# Patient Record
Sex: Male | Born: 1960 | Race: White | Hispanic: No | Smoking: Former smoker
Health system: Southern US, Community
[De-identification: ages and names within clinical notes are randomized; demographics above are authoritative.]

## PROBLEM LIST (undated history)

## (undated) DIAGNOSIS — E669 Obesity, unspecified: Secondary | ICD-10-CM

## (undated) DIAGNOSIS — E7800 Pure hypercholesterolemia, unspecified: Secondary | ICD-10-CM

## (undated) DIAGNOSIS — E119 Type 2 diabetes mellitus without complications: Secondary | ICD-10-CM

## (undated) DIAGNOSIS — I10 Essential (primary) hypertension: Secondary | ICD-10-CM

## (undated) HISTORY — PX: NOSE SURGERY: SHX723

---

## 2001-05-13 ENCOUNTER — Emergency Department (HOSPITAL_COMMUNITY): Admission: EM | Admit: 2001-05-13 | Discharge: 2001-05-14 | Payer: Self-pay | Admitting: Emergency Medicine

## 2008-06-04 ENCOUNTER — Emergency Department (HOSPITAL_COMMUNITY): Admission: EM | Admit: 2008-06-04 | Discharge: 2008-06-04 | Payer: Self-pay | Admitting: Emergency Medicine

## 2020-02-28 ENCOUNTER — Inpatient Hospital Stay (HOSPITAL_COMMUNITY)
Admission: EM | Admit: 2020-02-28 | Discharge: 2020-03-01 | DRG: 638 | Disposition: A | Discharge status: Home / Self Care | Payer: Self-pay | Attending: Internal Medicine | Admitting: Internal Medicine

## 2020-02-28 ENCOUNTER — Encounter (HOSPITAL_COMMUNITY): Payer: Self-pay | Admitting: Emergency Medicine

## 2020-02-28 ENCOUNTER — Other Ambulatory Visit: Payer: Self-pay

## 2020-02-28 DIAGNOSIS — E1310 Other specified diabetes mellitus with ketoacidosis without coma: Secondary | ICD-10-CM

## 2020-02-28 DIAGNOSIS — R7401 Elevation of levels of liver transaminase levels: Secondary | ICD-10-CM | POA: Diagnosis present

## 2020-02-28 DIAGNOSIS — E669 Obesity, unspecified: Secondary | ICD-10-CM | POA: Diagnosis present

## 2020-02-28 DIAGNOSIS — Z87891 Personal history of nicotine dependence: Secondary | ICD-10-CM

## 2020-02-28 DIAGNOSIS — Z79899 Other long term (current) drug therapy: Secondary | ICD-10-CM

## 2020-02-28 DIAGNOSIS — E1165 Type 2 diabetes mellitus with hyperglycemia: Secondary | ICD-10-CM | POA: Diagnosis present

## 2020-02-28 DIAGNOSIS — E119 Type 2 diabetes mellitus without complications: Secondary | ICD-10-CM

## 2020-02-28 DIAGNOSIS — Z20822 Contact with and (suspected) exposure to covid-19: Secondary | ICD-10-CM | POA: Diagnosis present

## 2020-02-28 DIAGNOSIS — I10 Essential (primary) hypertension: Secondary | ICD-10-CM | POA: Diagnosis present

## 2020-02-28 DIAGNOSIS — Z8249 Family history of ischemic heart disease and other diseases of the circulatory system: Secondary | ICD-10-CM

## 2020-02-28 DIAGNOSIS — E871 Hypo-osmolality and hyponatremia: Secondary | ICD-10-CM | POA: Diagnosis present

## 2020-02-28 DIAGNOSIS — E7800 Pure hypercholesterolemia, unspecified: Secondary | ICD-10-CM | POA: Diagnosis present

## 2020-02-28 DIAGNOSIS — Z6832 Body mass index (BMI) 32.0-32.9, adult: Secondary | ICD-10-CM

## 2020-02-28 DIAGNOSIS — E1110 Type 2 diabetes mellitus with ketoacidosis without coma: Principal | ICD-10-CM | POA: Diagnosis present

## 2020-02-28 DIAGNOSIS — D751 Secondary polycythemia: Secondary | ICD-10-CM | POA: Diagnosis present

## 2020-02-28 HISTORY — DX: Pure hypercholesterolemia, unspecified: E78.00

## 2020-02-28 HISTORY — DX: Type 2 diabetes mellitus without complications: E11.9

## 2020-02-28 HISTORY — DX: Essential (primary) hypertension: I10

## 2020-02-28 HISTORY — DX: Obesity, unspecified: E66.9

## 2020-02-28 NOTE — ED Provider Notes (Signed)
[facility] EMERGENCY DEPARTMENT Provider Note   CSN: [id number] Arrival date & time: 02/28/20  1841     History Chief Complaint  Patient presents with  . Hypertension    Glen Webb is a 59 y.o. male.  12/23 felt unwell went to [place]. Had been having polyuria and polydipsia. BP was 160/103 - blood work and urine. Did some type of tests. A1c 13.5. apparently a bunch of other things were elevated as well and was told to go the emergency room but didn't tell him what was going on. Had hyperglycemia as well in the 400's improved to less than 200 by eating differently (not eating at all). No recent illnesses otherwise.    Hypertension       Past Medical History:  Diagnosis Date  . Hypercholesteremia   . Hypertension     There are no problems to display for this patient.   Past Surgical History:  Procedure Laterality Date  . NOSE SURGERY         History reviewed. No pertinent family history.  Social History   Tobacco Use  . Smoking status: Former Games developer  . Smokeless tobacco: Never Used  Vaping Use  . Vaping Use: Never used  Substance Use Topics  . Alcohol use: Never  . Drug use: Never    Home Medications Prior to Admission medications   Not on File    Allergies    Patient has no allergy information on record.  Review of Systems   Review of Systems  All other systems reviewed and are negative.   Physical Exam Updated Vital Signs BP (!) 148/98   Pulse 96   Temp 98 F (36.7 C) (Oral)   Resp 13   Ht 5\' 6"  (1.676 m)   Wt 91.6 kg   SpO2 99%   BMI 32.60 kg/m   Physical Exam Vitals and nursing note reviewed.  Constitutional:      Appearance: He is well-developed and well-nourished.  HENT:     Head: Normocephalic and atraumatic.     Nose: No congestion or rhinorrhea.     Mouth/Throat:     Mouth: Mucous membranes are moist.     Pharynx: Oropharynx is clear.  Eyes:     Pupils: Pupils are equal, round, and reactive to light.   Cardiovascular:     Rate and Rhythm: Normal rate.  Pulmonary:     Effort: Pulmonary effort is normal. No respiratory distress.  Abdominal:     General: There is no distension.  Musculoskeletal:        General: Normal range of motion.     Cervical back: Normal range of motion.  Skin:    General: Skin is warm and dry.  Neurological:     General: No focal deficit present.     Mental Status: He is alert.     ED Results / Procedures / Treatments   Labs (all labs ordered are listed, but only abnormal results are displayed) Labs Reviewed - No data to display  EKG None  Radiology No results found.  Procedures .Critical Care Performed by: , MD Authorized by: Marily Memos, MD   Critical care provider statement:    Critical care time (minutes):  45   Critical care was necessary to treat or prevent imminent or life-threatening deterioration of the following conditions:  Endocrine crisis   Critical care was time spent personally by me on the following activities:  Discussions with consultants, evaluation of patient's response to treatment, examination  of patient, ordering and performing treatments and interventions, ordering and review of laboratory studies, ordering and review of radiographic studies, pulse oximetry, re-evaluation of patient's condition, obtaining history from patient or surrogate and review of old charts   (including critical care time)  Medications Ordered in ED Medications - No data to display  ED Course  I have reviewed the triage vital signs and the nursing notes.  Pertinent labs & imaging results that were available during my care of the patient were reviewed by me and considered in my medical decision making (see chart for details).     MDM Rules/Calculators/A&P                          Patient found to have likely nearly euglycemic DKA but also mild AKI. BP reasonable. Fluids given. glucostabilizer initiated. Patient admitted to  hospitalist for same.   Final Clinical Impression(s) / ED Diagnoses Final diagnoses:  Diabetic ketoacidosis without coma associated with other specified diabetes mellitus [facility])    Rx / DC Orders ED Discharge Orders    None       Sian Rockers, Barbara Cower, MD 03/06/20 2311

## 2020-02-28 NOTE — ED Notes (Signed)
Pt checked blood sugar at home and was 431 yesterday, pt decreased his food intake and blood sugar came down to 210. Pt was seen at Urgent Care last week for not feeling well. Pt reports he had a hgba1c checked last week which was 13.5. Provider was concerned about kidney function and ketones in his urine. Amlodipine was increased from 5mg  to 10mg .

## 2020-02-28 NOTE — ED Triage Notes (Signed)
Pt c/o hypertension for the past week.  Pt was just recently placed on amlodipine.

## 2020-02-29 ENCOUNTER — Encounter (HOSPITAL_COMMUNITY): Payer: Self-pay | Admitting: Internal Medicine

## 2020-02-29 ENCOUNTER — Observation Stay (HOSPITAL_COMMUNITY): Payer: Self-pay

## 2020-02-29 DIAGNOSIS — D751 Secondary polycythemia: Secondary | ICD-10-CM | POA: Diagnosis present

## 2020-02-29 DIAGNOSIS — E1110 Type 2 diabetes mellitus with ketoacidosis without coma: Principal | ICD-10-CM

## 2020-02-29 DIAGNOSIS — E871 Hypo-osmolality and hyponatremia: Secondary | ICD-10-CM | POA: Diagnosis present

## 2020-02-29 DIAGNOSIS — E7800 Pure hypercholesterolemia, unspecified: Secondary | ICD-10-CM | POA: Diagnosis present

## 2020-02-29 DIAGNOSIS — R7401 Elevation of levels of liver transaminase levels: Secondary | ICD-10-CM | POA: Diagnosis present

## 2020-02-29 DIAGNOSIS — E669 Obesity, unspecified: Secondary | ICD-10-CM | POA: Diagnosis present

## 2020-02-29 DIAGNOSIS — I10 Essential (primary) hypertension: Secondary | ICD-10-CM | POA: Diagnosis present

## 2020-02-29 DIAGNOSIS — E119 Type 2 diabetes mellitus without complications: Secondary | ICD-10-CM

## 2020-02-29 DIAGNOSIS — E1165 Type 2 diabetes mellitus with hyperglycemia: Secondary | ICD-10-CM | POA: Diagnosis present

## 2020-02-29 LAB — HEPATIC FUNCTION PANEL
ALT: 78 U/L — ABNORMAL HIGH (ref 0–44)
AST: 77 U/L — ABNORMAL HIGH (ref 15–41)
Albumin: 4.1 g/dL (ref 3.5–5.0)
Alkaline Phosphatase: 93 U/L (ref 38–126)
Total Bilirubin: 1.1 mg/dL (ref 0.3–1.2)
Total Protein: 6.8 g/dL (ref 6.5–8.1)

## 2020-02-29 LAB — CBC WITH DIFFERENTIAL/PLATELET
Abs Immature Granulocytes: 0.03 10*3/uL (ref 0.00–0.07)
Basophils Absolute: 0 10*3/uL (ref 0.0–0.1)
Basophils Relative: 0 %
Eosinophils Absolute: 0.1 10*3/uL (ref 0.0–0.5)
Eosinophils Relative: 1 %
HCT: 46.8 % (ref 39.0–52.0)
Hemoglobin: 17.2 g/dL — ABNORMAL HIGH (ref 13.0–17.0)
Immature Granulocytes: 0 %
Lymphocytes Relative: 21 %
Lymphs Abs: 1.5 10*3/uL (ref 0.7–4.0)
MCH: 31.7 pg (ref 26.0–34.0)
MCHC: 36.8 g/dL — ABNORMAL HIGH (ref 30.0–36.0)
MCV: 86.2 fL (ref 80.0–100.0)
Monocytes Absolute: 0.5 10*3/uL (ref 0.1–1.0)
Monocytes Relative: 7 %
Neutro Abs: 5.2 10*3/uL (ref 1.7–7.7)
Neutrophils Relative %: 71 %
Platelets: 232 10*3/uL (ref 150–400)
RBC: 5.43 MIL/uL (ref 4.22–5.81)
RDW: 13.2 % (ref 11.5–15.5)
WBC: 7.3 10*3/uL (ref 4.0–10.5)
nRBC: 0 % (ref 0.0–0.2)

## 2020-02-29 LAB — BLOOD GAS, VENOUS
Acid-base deficit: 14.2 mmol/L — ABNORMAL HIGH (ref 0.0–2.0)
Bicarbonate: 12.7 mmol/L — ABNORMAL LOW (ref 20.0–28.0)
Drawn by: 1528
FIO2: 98
O2 Saturation: 48.3 %
Patient temperature: 37
pCO2, Ven: 32.3 mmHg — ABNORMAL LOW (ref 44.0–60.0)
pH, Ven: 7.204 — ABNORMAL LOW (ref 7.250–7.430)
pO2, Ven: <31 mmHg — CL (ref 32.0–45.0)

## 2020-02-29 LAB — CBG MONITORING, ED
Glucose-Capillary: 305 mg/dL — ABNORMAL HIGH (ref 70–99)
Glucose-Capillary: 241 mg/dL — ABNORMAL HIGH (ref 70–99)
Glucose-Capillary: 200 mg/dL — ABNORMAL HIGH (ref 70–99)
Glucose-Capillary: 208 mg/dL — ABNORMAL HIGH (ref 70–99)
Glucose-Capillary: 192 mg/dL — ABNORMAL HIGH (ref 70–99)
Glucose-Capillary: 167 mg/dL — ABNORMAL HIGH (ref 70–99)
Glucose-Capillary: 147 mg/dL — ABNORMAL HIGH (ref 70–99)
Glucose-Capillary: 135 mg/dL — ABNORMAL HIGH (ref 70–99)
Glucose-Capillary: 136 mg/dL — ABNORMAL HIGH (ref 70–99)
Glucose-Capillary: 122 mg/dL — ABNORMAL HIGH (ref 70–99)
Glucose-Capillary: 140 mg/dL — ABNORMAL HIGH (ref 70–99)
Glucose-Capillary: 142 mg/dL — ABNORMAL HIGH (ref 70–99)
Glucose-Capillary: 126 mg/dL — ABNORMAL HIGH (ref 70–99)
Glucose-Capillary: 131 mg/dL — ABNORMAL HIGH (ref 70–99)
Glucose-Capillary: 141 mg/dL — ABNORMAL HIGH (ref 70–99)
Glucose-Capillary: 129 mg/dL — ABNORMAL HIGH (ref 70–99)
Glucose-Capillary: 127 mg/dL — ABNORMAL HIGH (ref 70–99)
Glucose-Capillary: 105 mg/dL — ABNORMAL HIGH (ref 70–99)
Glucose-Capillary: 113 mg/dL — ABNORMAL HIGH (ref 70–99)

## 2020-02-29 LAB — URINALYSIS, ROUTINE W REFLEX MICROSCOPIC
Bacteria, UA: NONE SEEN
Bilirubin Urine: NEGATIVE
Glucose, UA: >=500 mg/dL — AB
Ketones, ur: 80 mg/dL — AB
Leukocytes,Ua: NEGATIVE
Nitrite: NEGATIVE
Protein, ur: 30 mg/dL — AB
Specific Gravity, Urine: 1.016 (ref 1.005–1.030)
pH: 5 (ref 5.0–8.0)

## 2020-02-29 LAB — BASIC METABOLIC PANEL
Anion gap: 10 (ref 5–15)
Anion gap: 12 (ref 5–15)
Anion gap: 12 (ref 5–15)
Anion gap: 16 — ABNORMAL HIGH (ref 5–15)
Anion gap: 17 — ABNORMAL HIGH (ref 5–15)
BUN: 10 mg/dL (ref 6–20)
BUN: 11 mg/dL (ref 6–20)
BUN: 11 mg/dL (ref 6–20)
BUN: 11 mg/dL (ref 6–20)
BUN: 9 mg/dL (ref 6–20)
CO2: 10 mmol/L — ABNORMAL LOW (ref 22–32)
CO2: 12 mmol/L — ABNORMAL LOW (ref 22–32)
CO2: 13 mmol/L — ABNORMAL LOW (ref 22–32)
CO2: 15 mmol/L — ABNORMAL LOW (ref 22–32)
CO2: 8 mmol/L — ABNORMAL LOW (ref 22–32)
Calcium: 8.2 mg/dL — ABNORMAL LOW (ref 8.9–10.3)
Calcium: 8.2 mg/dL — ABNORMAL LOW (ref 8.9–10.3)
Calcium: 8.3 mg/dL — ABNORMAL LOW (ref 8.9–10.3)
Calcium: 8.4 mg/dL — ABNORMAL LOW (ref 8.9–10.3)
Calcium: 8.7 mg/dL — ABNORMAL LOW (ref 8.9–10.3)
Chloride: 104 mmol/L (ref 98–111)
Chloride: 105 mmol/L (ref 98–111)
Chloride: 108 mmol/L (ref 98–111)
Chloride: 109 mmol/L (ref 98–111)
Chloride: 96 mmol/L — ABNORMAL LOW (ref 98–111)
Creatinine, Ser: 0.61 mg/dL (ref 0.61–1.24)
Creatinine, Ser: 0.69 mg/dL (ref 0.61–1.24)
Creatinine, Ser: 0.7 mg/dL (ref 0.61–1.24)
Creatinine, Ser: 0.78 mg/dL (ref 0.61–1.24)
Creatinine, Ser: 0.96 mg/dL (ref 0.61–1.24)
GFR, Estimated: >60 mL/min (ref >60)
GFR, Estimated: >60 mL/min (ref >60)
GFR, Estimated: >60 mL/min (ref >60)
GFR, Estimated: >60 mL/min (ref >60)
GFR, Estimated: >60 mL/min (ref >60)
Glucose, Bld: 129 mg/dL — ABNORMAL HIGH (ref 70–99)
Glucose, Bld: 133 mg/dL — ABNORMAL HIGH (ref 70–99)
Glucose, Bld: 161 mg/dL — ABNORMAL HIGH (ref 70–99)
Glucose, Bld: 202 mg/dL — ABNORMAL HIGH (ref 70–99)
Glucose, Bld: 254 mg/dL — ABNORMAL HIGH (ref 70–99)
Potassium: 3.3 mmol/L — ABNORMAL LOW (ref 3.5–5.1)
Potassium: 3.3 mmol/L — ABNORMAL LOW (ref 3.5–5.1)
Potassium: 3.5 mmol/L (ref 3.5–5.1)
Potassium: 3.8 mmol/L (ref 3.5–5.1)
Potassium: 4 mmol/L (ref 3.5–5.1)
Sodium: 123 mmol/L — ABNORMAL LOW (ref 135–145)
Sodium: 127 mmol/L — ABNORMAL LOW (ref 135–145)
Sodium: 128 mmol/L — ABNORMAL LOW (ref 135–145)
Sodium: 134 mmol/L — ABNORMAL LOW (ref 135–145)
Sodium: 135 mmol/L (ref 135–145)

## 2020-02-29 LAB — BETA-HYDROXYBUTYRIC ACID
Beta-Hydroxybutyric Acid: 8.03 mmol/L — ABNORMAL HIGH (ref 0.05–0.27)
Beta-Hydroxybutyric Acid: 3.61 mmol/L — ABNORMAL HIGH (ref 0.05–0.27)
Beta-Hydroxybutyric Acid: 1.69 mmol/L — ABNORMAL HIGH (ref 0.05–0.27)

## 2020-02-29 LAB — HEMOGLOBIN A1C
Hgb A1c MFr Bld: 13 % — ABNORMAL HIGH (ref 4.8–5.6)
Mean Plasma Glucose: 326.4 mg/dL

## 2020-02-29 LAB — TSH: TSH: 1.923 u[IU]/mL (ref 0.350–4.500)

## 2020-02-29 LAB — PHOSPHORUS: Phosphorus: 1.8 mg/dL — ABNORMAL LOW (ref 2.5–4.6)

## 2020-02-29 LAB — MAGNESIUM
Magnesium: 2.3 mg/dL (ref 1.7–2.4)
Magnesium: 1.8 mg/dL (ref 1.7–2.4)

## 2020-02-29 MED ORDER — METFORMIN HCL ER 500 MG PO TB24
500.0000 mg | ORAL_TABLET | Freq: Every day | ORAL | Status: DC
  Administered 2020-03-01: 11:00:00 500 mg via ORAL
  Filled 2020-02-29 (×4): qty 1

## 2020-02-29 MED ORDER — POTASSIUM CHLORIDE CRYS ER 20 MEQ PO TBCR
40.0000 meq | EXTENDED_RELEASE_TABLET | Freq: Once | ORAL | Status: AC
  Administered 2020-02-29: 22:00:00 40 meq via ORAL
  Filled 2020-02-29: qty 2

## 2020-02-29 MED ORDER — ONDANSETRON HCL 4 MG PO TABS: 4.0000 mg | ORAL_TABLET | Freq: Four times a day (QID) | ORAL | Status: DC | PRN

## 2020-02-29 MED ORDER — POTASSIUM CHLORIDE 10 MEQ/100ML IV SOLN
10.0000 meq | INTRAVENOUS | Status: AC
  Administered 2020-02-29 (×4): 10 meq via INTRAVENOUS
  Filled 2020-02-29 (×4): qty 100

## 2020-02-29 MED ORDER — ACETAMINOPHEN 650 MG RE SUPP: 650.0000 mg | Freq: Four times a day (QID) | RECTAL | Status: DC | PRN

## 2020-02-29 MED ORDER — RIVAROXABAN 10 MG PO TABS
10.0000 mg | ORAL_TABLET | Freq: Every day | ORAL | Status: DC
  Administered 2020-02-29: 19:00:00 10 mg via ORAL
  Filled 2020-02-29 (×4): qty 1

## 2020-02-29 MED ORDER — INSULIN REGULAR(HUMAN) IN NACL 100-0.9 UT/100ML-% IV SOLN
INTRAVENOUS | Status: AC
  Administered 2020-02-29: 2 [IU]/h via INTRAVENOUS
  Administered 2020-02-29: 19 [IU]/h via INTRAVENOUS
  Filled 2020-02-29 (×2): qty 100

## 2020-02-29 MED ORDER — ENOXAPARIN SODIUM 40 MG/0.4ML ~~LOC~~ SOLN
40.0000 mg | SUBCUTANEOUS | Status: DC
  Filled 2020-02-29: qty 0.4

## 2020-02-29 MED ORDER — POTASSIUM CHLORIDE IN NACL 20-0.9 MEQ/L-% IV SOLN
INTRAVENOUS | Status: AC
  Filled 2020-02-29 (×2): qty 1000

## 2020-02-29 MED ORDER — POTASSIUM CHLORIDE 10 MEQ/100ML IV SOLN
10.0000 meq | INTRAVENOUS | Status: AC
  Administered 2020-02-29 (×4): 10 meq via INTRAVENOUS
  Filled 2020-02-29 (×4): qty 100

## 2020-02-29 MED ORDER — LACTATED RINGERS IV BOLUS: 20.0000 mL/kg | Freq: Once | INTRAVENOUS | Status: DC

## 2020-02-29 MED ORDER — INSULIN GLARGINE 100 UNIT/ML ~~LOC~~ SOLN
40.0000 [IU] | SUBCUTANEOUS | Status: DC
  Administered 2020-02-29: 19:00:00 40 [IU] via SUBCUTANEOUS
  Filled 2020-02-29 (×2): qty 0.4
  Filled 2020-02-29: qty 1

## 2020-02-29 MED ORDER — INSULIN ASPART 100 UNIT/ML ~~LOC~~ SOLN
0.0000 [IU] | Freq: Three times a day (TID) | SUBCUTANEOUS | Status: DC
  Administered 2020-03-01: 10:00:00 3 [IU] via SUBCUTANEOUS
  Filled 2020-02-29: qty 1

## 2020-02-29 MED ORDER — ONDANSETRON HCL 4 MG/2ML IJ SOLN
4.0000 mg | Freq: Four times a day (QID) | INTRAMUSCULAR | Status: DC | PRN
  Administered 2020-02-29: 02:00:00 4 mg via INTRAVENOUS
  Filled 2020-02-29: qty 2

## 2020-02-29 MED ORDER — DEXTROSE 50 % IV SOLN: 0.0000 mL | INTRAVENOUS | Status: DC | PRN

## 2020-02-29 MED ORDER — INSULIN ASPART 100 UNIT/ML ~~LOC~~ SOLN: 0.0000 [IU] | Freq: Every day | SUBCUTANEOUS | Status: DC

## 2020-02-29 MED ORDER — POTASSIUM CHLORIDE 2 MEQ/ML IV SOLN
INTRAVENOUS | Status: DC
  Filled 2020-02-29 (×3): qty 1000

## 2020-02-29 MED ORDER — SODIUM BICARBONATE 650 MG PO TABS
650.0000 mg | ORAL_TABLET | Freq: Three times a day (TID) | ORAL | Status: DC
  Administered 2020-02-29 – 2020-03-01 (×2): 650 mg via ORAL
  Filled 2020-02-29 (×4): qty 1

## 2020-02-29 MED ORDER — MAGNESIUM SULFATE 4 GM/100ML IV SOLN
4.0000 g | Freq: Once | INTRAVENOUS | Status: AC
  Administered 2020-02-29: 19:00:00 4 g via INTRAVENOUS
  Filled 2020-02-29: qty 100

## 2020-02-29 MED ORDER — AMLODIPINE BESYLATE 5 MG PO TABS
10.0000 mg | ORAL_TABLET | Freq: Every day | ORAL | Status: DC
  Administered 2020-02-29 – 2020-03-01 (×2): 10 mg via ORAL
  Filled 2020-02-29 (×2): qty 2

## 2020-02-29 MED ORDER — DEXTROSE IN LACTATED RINGERS 5 % IV SOLN: INTRAVENOUS | Status: DC

## 2020-02-29 MED ORDER — FAMOTIDINE IN NACL 20-0.9 MG/50ML-% IV SOLN
20.0000 mg | Freq: Once | INTRAVENOUS | Status: DC
  Filled 2020-02-29: qty 50

## 2020-02-29 MED ORDER — INSULIN ASPART 100 UNIT/ML ~~LOC~~ SOLN
10.0000 [IU] | Freq: Three times a day (TID) | SUBCUTANEOUS | Status: DC
  Administered 2020-03-01: 10:00:00 10 [IU] via SUBCUTANEOUS
  Filled 2020-02-29: qty 1

## 2020-02-29 MED ORDER — ACETAMINOPHEN 325 MG PO TABS
650.0000 mg | ORAL_TABLET | Freq: Four times a day (QID) | ORAL | Status: DC | PRN
  Administered 2020-02-29: 650 mg via ORAL
  Filled 2020-02-29: qty 2

## 2020-02-29 MED ORDER — APIXABAN 2.5 MG PO TABS: 2.5000 mg | ORAL_TABLET | Freq: Two times a day (BID) | ORAL | Status: DC

## 2020-02-29 MED ORDER — K PHOS MONO-SOD PHOS DI & MONO 155-852-130 MG PO TABS
500.0000 mg | ORAL_TABLET | Freq: Three times a day (TID) | ORAL | Status: DC
  Administered 2020-02-29: 10:00:00 500 mg via ORAL
  Filled 2020-02-29 (×3): qty 2

## 2020-02-29 MED ORDER — ENOXAPARIN SODIUM 40 MG/0.4ML ~~LOC~~ SOLN: 40.0000 mg | SUBCUTANEOUS | Status: DC

## 2020-02-29 MED ORDER — LACTATED RINGERS IV BOLUS
20.0000 mL/kg | Freq: Once | INTRAVENOUS | Status: AC
  Administered 2020-02-29: 1832 mL via INTRAVENOUS

## 2020-02-29 MED ORDER — LACTATED RINGERS IV SOLN: INTRAVENOUS | Status: DC

## 2020-02-29 NOTE — ED Notes (Signed)
1700 dose of Xarelto not yet given due to not having the medication brought from pharmacy by nursing supervisor at this time. Nursing supervisor aware and will bring down when available to do so. Oncoming RN [employer] notified.

## 2020-02-29 NOTE — ED Notes (Signed)
Date and time results received: 02/29/20 0113 Test: pO2 via venous blood gas Critical Value: 31 Name of Provider Notified: Dr. Clayborne Dana

## 2020-02-29 NOTE — ED Notes (Addendum)
Pt reports nausea, requests cold wash cloth to put on back of his neck. Cool cloth provided. Offered to ask provider for nausea med, pt declines as this time. Pt thinks he drank too much water on an empty stomach.

## 2020-02-29 NOTE — Progress Notes (Signed)
02/29/2020 9:28 AM  Pt remains acidotic with high serum ketones so it is not appropriate to transition him off IV insulin at this time.    Maryln Manuel MD  How to contact the [facility] Attending or Consulting provider 7A - 7P or covering provider during after hours 7P -7A, for this patient?  1. Check the care team in [facility] and look for a) attending/consulting TRH provider listed and b) the [facility] team listed 2. Log into [email or web address] and use [facility]'s universal password to access. If you do not have the password, please contact the hospital operator. 3. Locate the [facility] provider you are looking for under [facility] and page to a number that you can be directly reached. 4. If you still have difficulty reaching the provider, please page the [facility] (Director on Call) for the Hospitalists listed on amion for assistance.

## 2020-02-29 NOTE — ED Notes (Signed)
Pt refused covid swab, MD aware.

## 2020-02-29 NOTE — Progress Notes (Signed)
02/29/2020 6:44 PM    IV insulin transition orders entered. Lantus 40 units daily plus novolog 10 units TIDAC plus SSI supplemental coverage.  DC IV insulin 2 hours after Lantus has been given.   Carb modified diet  Glucophage XR 500 mg daily with breakfast  CBG monitoring every 1 hour WHILE ON IV INSULIN  After OFF IV insulin, CBG monitoring TIDACHS and 3am   Aissata Wilmore  MD How to contact the [facility] Attending or Consulting provider 7A - 7P or covering provider during after hours 7P -7A, for this patient?  1. Check the care team in [facility] and look for a) attending/consulting TRH provider listed and b) the [facility] team listed 2. Log into [email or web address] and use [place]'s universal password to access. If you do not have the password, please contact the hospital operator. 3. Locate the [facility] provider you are looking for under [facility] and page to a number that you can be directly reached. 4. If you still have difficulty reaching the provider, please page the [facility] (Director on Call) for the Hospitalists listed on amion for assistance.

## 2020-02-29 NOTE — ED Notes (Signed)
Pt lantus was given at 1929. Will continue insulin drip for 2 hours and discontinue at 2129. Will recheck BG at that time and give novolog if needed. Waiting for pharmacy to verify NACL with KCL 20.

## 2020-02-29 NOTE — H&P (Signed)
History and Physical    Glen Webb:[id number] DOB: 08/27/1960 DOA: 02/28/2020  PCP: Patient, No Pcp Per   Patient coming from: Home.  I have personally briefly reviewed patient's old medical records in [facility]  Chief Complaint: Abnormal labs.  HPI: Glen Webb is a 59 y.o. male with medical history significant of class I obesity, hypercholesterolemia, hypertension, just diagnosed with type 2 diabetes mellitus who is coming to the emergency department due to abnormal lab results.  The patient states that on 02/24/2020 he did not feel well and he went to the [facility] and have some lab work done.  He was called yesterday and was told that his hemoglobin A1c is 13.5 that his LFTs were abnormal.  He checked his CBG yesterday evening hyperglycemia in the 400s so he was told to come to the ED today.  He has been having nausea and mild epigastric discomfort.  He does not complain much of polydipsia, but has had some significant polyuria in the past week.  He denies blurred vision.  Denies fever, chills, headache, rhinorrhea, sore throat, wheezing or hemoptysis.  No chest pain, dyspnea, palpitations, diaphoresis, PND, orthopnea or pitting edema of the lower extremities.  No emesis, diarrhea, constipation, melena or hematochezia.  No dysuria, frequency or hematuria.  ED Course: Initial vital signs were temperature 98.8 F, pulse 102, respiration 18, BP 159/93 mmHg O2 sat 100% on room air.  The patient received IV fluids and a continuous insulin infusion was begun.  Labwork: CBC showed a white count of 7.3, ammonia 17.2 g/dL and platelets 956.  BMP sodium of 123, potassium 3.5, chloride 96 and CO2 10 mmol/L.  His anion gap was 17.  Glucose 254, BUN 11, creatinine 0.96 and calcium 8.7.  Magnesium was 2.3 and phosphorus 1.8 mg/dL.  Venous gas shows a pH of 7.204, PCO2 of 32.3 and PO2 of less than 31 mmHg.  Bicarbonate was 12.7 and acid base deficit was 14.2 mmol/L.  Beta hydroxybutyric acid  was 8.03 mmol/L.  Liver function tests showed AST of 77 and ALT of 78 units/L.  The rest of the hepatic functions are unremarkable.  Review of Systems: As per HPI otherwise all other systems reviewed and are negative.  Past Medical History:  Diagnosis Date  . Class 1 obesity   . Hypercholesteremia   . Hypertension   . Type 2 diabetes mellitus (HCC)    Past Surgical History:  Procedure Laterality Date  . NOSE SURGERY     Social History  reports that he has quit smoking. He has never used smokeless tobacco. He reports that he does not drink alcohol and does not use drugs.  Not on File  Family History  Problem Relation Age of Onset  . Hypertension Mother   . CAD Father   . Heart attack Father    Medications: Amlodipine 10 mg p.o. daily.  Physical Exam: Vitals:   02/29/20 0000 02/29/20 0030 02/29/20 0100 02/29/20 0133  BP: (!) 144/94   (!) 153/89  Pulse: 96 (!) 101 (!) 101 99  Resp: (!) 25 20 (!) 25 18  Temp:      TempSrc:      SpO2: 99% 100% 100% 100%  Weight:      Height:       Constitutional: Looks acutely ill.  NAD, calm, comfortable Eyes: PERRL, lids and conjunctivae normal ENMT: Mucous membranes are dry. Posterior pharynx clear of any exudate or lesions. Neck: normal, supple, no masses, no thyromegaly  Respiratory: clear to auscultation bilaterally, no wheezing, no crackles. Normal respiratory effort. No accessory muscle use.  Cardiovascular: Tachycardic in the low 100s with a regular rhythm, no murmurs / rubs / gallops. No extremity edema. 2+ pedal pulses. No carotid bruits.  Abdomen: Obese, nondistended.  Bowel sounds positive.  Soft, no tenderness, no masses palpated. No hepatosplenomegaly. Musculoskeletal: no clubbing / cyanosis.  Good ROM, no contractures. Normal muscle tone.  Skin: no rashes, lesions, ulcers on very limited dermatological examination. Neurologic: CN 2-12 grossly intact. Sensation intact, DTR normal. Strength 5/5 in all 4.  Psychiatric: Normal  judgment and insight. Alert and oriented x 3. Normal mood.   Labs on Admission: I have personally reviewed following labs and imaging studies  CBC: Recent Labs  Lab 02/28/20 2357  WBC 7.3  NEUTROABS 5.2  HGB 17.2*  HCT 46.8  MCV 86.2  PLT 232    Basic Metabolic Panel: Recent Labs  Lab 02/28/20 2357  NA 123*  K 3.5  CL 96*  CO2 10*  GLUCOSE 254*  BUN 11  CREATININE 0.96  CALCIUM 8.7*    GFR: Estimated Creatinine Clearance: 89.9 mL/min (by C-G formula based on SCr of 0.96 mg/dL).  Liver Function Tests: Recent Labs  Lab 02/29/20 0138  AST 77*  ALT 78*  ALKPHOS 93  BILITOT 1.1  PROT 6.8  ALBUMIN 4.1    Urine analysis: No results found for: COLORURINE, APPEARANCEUR, LABSPEC, PHURINE, GLUCOSEU, HGBUR, BILIRUBINUR, KETONESUR, PROTEINUR, UROBILINOGEN, NITRITE, LEUKOCYTESUR  Radiological Exams on Admission: No results found.  EKG: Independently reviewed. Vent. rate 96 BPM PR interval * ms QRS duration 88 ms QT/QTc 331/419 ms P-R-T axes 58 69 -74 Sinus rhythm Consider right atrial enlargement Probable right ventricular hypertrophy Nonspecific T abnormalities, diffuse leads  Assessment/Plan Principal Problem:   DKA (diabetic ketoacidosis) (HCC)   Type 2 diabetes mellitus (HCC) Observation/stepdown. Keep n.p.o. Continue IV fluids. On insulin infusion per Endo tool. Close CBG monitoring. BMP every 4 hours. BHA level every 8 hours. Consult diabetes coordinator.  Active Problems:   Hyponatremia GU losses/polydipsia/hyperglycemia. Continue IV fluids. Check urine sodium. Follow sodium level.    Hypophosphatemia Oral replacement ordered. Follow-up level as needed.    Polycythemia Likely hemoconcentrated. Continue IV hydration. Follow-up H&H.    Transaminitis Repeat transaminases level in a.m. Check right upper quadrant ultrasound.    Hypocalcemia Repeat level in a.m.  Further work-up depending on results.    Hypertension Amlodipine  recently increased from 5 to 10 mg. Declined to be switch to ACE inhibitor. Continue amlodipine 10 mg p.o. daily. Monitor blood pressure. Follow-up with PCP.    Hypercholesteremia Follow-up with PCP.    Class 1 obesity Advised lifestyle modifications.     DVT prophylaxis: Lovenox SQ. Code Status:   Full code. Family Communication: Disposition Plan:   Patient is from:  Home.  Anticipated DC to:  Home.  Anticipated DC date:  02/29/2020.  Anticipated DC barriers: Clinical status.  Consults called: Admission status:  Observation/stepdown.   Severity of Illness: High due to new onset diabetes presenting with DKA requiring IV fluids and continuous insulin infusion.  Bobette Mo MD [facility]  How to contact the [facility] Attending or Consulting provider 7A - 7P or covering provider during after hours 7P -7A, for this patient?   1. Check the care team in [facility] and look for a) attending/consulting TRH provider listed and b) the [facility] team listed 2. Log into [email or web address] and use [place]'s universal password to access. If you do  not have the password, please contact the hospital operator. 3. Locate the [facility] provider you are looking for under [facility] and page to a number that you can be directly reached. 4. If you still have difficulty reaching the provider, please page the [facility] (Director on Call) for the Hospitalists listed on amion for assistance.  02/29/2020, 3:01 AM   This document was prepared using Dragon voice recognition software and may contain some unintended transcription errors.

## 2020-02-29 NOTE — Progress Notes (Addendum)
ANTICOAGULATION CONSULT NOTE - Initial Consult  Pharmacy Consult for xarelto Indication: VTE prophylaxis  Not on File  Patient Measurements: Height: 5\' 6"  (167.6 cm) Weight: 91.6 kg (202 lb) IBW/kg (Calculated) : 63.8  Vital Signs: Temp: 98 F (36.7 C) (12/27 2132) Temp Source: Oral (12/27 2132) BP: 126/80 (12/28 0700) Pulse Rate: 82 (12/28 0700)  Labs: Recent Labs    02/28/20 2357 02/29/20 0352  HGB 17.2*  --   HCT 46.8  --   PLT 232  --   CREATININE 0.96 0.78    Estimated Creatinine Clearance: 107.9 mL/min (by C-G formula based on SCr of 0.78 mg/dL).   Medical History: Past Medical History:  Diagnosis Date  . Class 1 obesity   . Hypercholesteremia   . Hypertension   . Type 2 diabetes mellitus (HCC)     Medications:  See med rec  Assessment: Patient refusing lovenox DVT px, pharmacy asked to start xarelto orally  Goal of Therapy:  Monitor platelets by anticoagulation protocol: Yes   Plan:  xarelto 10mg  daily in the eveing Monitor for s/s of bleeding  03/02/20, BS , BCPS Clinical Pharmacist Pager [phone] 02/29/2020,7:38 AM

## 2020-02-29 NOTE — ED Notes (Signed)
Marissa, Nursing Supervisor made aware of need for 1700 dose of Xarelto from pharmacy.

## 2020-02-29 NOTE — ED Notes (Addendum)
Dr. Laural Benes notified that Endotool indicates time for transition to subcutaneous insulin. Dr. Laural Benes wants to continue IV insulin at this time.

## 2020-02-29 NOTE — ED Notes (Signed)
Pt educated on the purpose and importance of receiving VTE prophylaxis. States he would like to hold for now.

## 2020-02-29 NOTE — Progress Notes (Signed)
Inpatient Diabetes Program Recommendations  AACE/ADA: New Consensus Statement on Inpatient Glycemic Control (2015)  Target Ranges:  Prepandial:   less than 140 mg/dL      Peak postprandial:   less than 180 mg/dL (1-2 hours)      Critically ill patients:  140 - 180 mg/dL   Lab Results  Component Value Date   GLUCAP 147 (H) 02/29/2020    Review of Glycemic Control Results for Glen Webb, Glen Webb (MRN [id number]) as of 02/29/2020 09:19  Ref. Range 02/28/2020 23:57 02/29/2020 07:40  Beta-Hydroxybutyric Acid Latest Ref Range: 0.05 - 0.27 mmol/L 8.03 (H) 3.61 (H)   Diabetes history: Recent new onset type 2 Outpatient Diabetes medications: None listed Current orders for Inpatient glycemic control: IV insulin per endotool  Inpatient Diabetes Program Recommendations:   Noted patient is recent new onset but no notes visible in Epic regarding office visit or labs of A1c 13.5.  Will plan to speak with patient. Noted no insurance, so may be more affordable for Novolin Relion insulin @ discharge.  Consult to transitions of care team for medication assistance.  Thank you, Billy Fischer. Shaunte Tuft, RN, MSN, CDE  Diabetes Coordinator Inpatient Glycemic Control Team Team Pager [phone] (8am-5pm) 02/29/2020 9:28 AM

## 2020-03-01 DIAGNOSIS — E871 Hypo-osmolality and hyponatremia: Secondary | ICD-10-CM

## 2020-03-01 DIAGNOSIS — I10 Essential (primary) hypertension: Secondary | ICD-10-CM

## 2020-03-01 DIAGNOSIS — E1310 Other specified diabetes mellitus with ketoacidosis without coma: Secondary | ICD-10-CM

## 2020-03-01 DIAGNOSIS — E7800 Pure hypercholesterolemia, unspecified: Secondary | ICD-10-CM

## 2020-03-01 LAB — MAGNESIUM: Magnesium: 2.3 mg/dL (ref 1.7–2.4)

## 2020-03-01 LAB — BASIC METABOLIC PANEL
Anion gap: 8 (ref 5–15)
BUN: 7 mg/dL (ref 6–20)
CO2: 17 mmol/L — ABNORMAL LOW (ref 22–32)
Calcium: 7.9 mg/dL — ABNORMAL LOW (ref 8.9–10.3)
Chloride: 108 mmol/L (ref 98–111)
Creatinine, Ser: 0.71 mg/dL (ref 0.61–1.24)
GFR, Estimated: >60 mL/min (ref >60)
Glucose, Bld: 186 mg/dL — ABNORMAL HIGH (ref 70–99)
Potassium: 4 mmol/L (ref 3.5–5.1)
Sodium: 133 mmol/L — ABNORMAL LOW (ref 135–145)

## 2020-03-01 LAB — CBG MONITORING, ED
Glucose-Capillary: 178 mg/dL — ABNORMAL HIGH (ref 70–99)
Glucose-Capillary: 182 mg/dL — ABNORMAL HIGH (ref 70–99)
Glucose-Capillary: 161 mg/dL — ABNORMAL HIGH (ref 70–99)

## 2020-03-01 LAB — HIV ANTIBODY (ROUTINE TESTING W REFLEX): HIV Screen 4th Generation wRfx: NONREACTIVE

## 2020-03-01 LAB — SARS CORONAVIRUS 2 (TAT 6-24 HRS): SARS Coronavirus 2: NEGATIVE

## 2020-03-01 LAB — C-PEPTIDE: C-Peptide: 0.7 ng/mL — ABNORMAL LOW (ref 1.1–4.4)

## 2020-03-01 MED ORDER — INSULIN ASPART PROT & ASPART (70-30 MIX) 100 UNIT/ML ~~LOC~~ SUSP
25.0000 [IU] | Freq: Two times a day (BID) | SUBCUTANEOUS | Status: DC
  Filled 2020-03-01: qty 10

## 2020-03-01 MED ORDER — LIVING WELL WITH DIABETES BOOK
Freq: Once | Status: DC
  Filled 2020-03-01: qty 1

## 2020-03-01 MED ORDER — INSULIN STARTER KIT- PEN NEEDLES (ENGLISH)
1.0000 | Freq: Once | Status: DC
  Filled 2020-03-01: qty 1

## 2020-03-01 MED ORDER — METFORMIN HCL 500 MG PO TABS: 500.0000 mg | ORAL_TABLET | Freq: Every day | ORAL | 1 refills | Status: DC

## 2020-03-01 MED ORDER — NOVOLIN 70/30 RELION (70-30) 100 UNIT/ML ~~LOC~~ SUSP: 25.0000 [IU] | Freq: Two times a day (BID) | SUBCUTANEOUS | 1 refills | Status: DC

## 2020-03-01 NOTE — Progress Notes (Signed)
Inpatient Diabetes Program Recommendations  AACE/ADA: New Consensus Statement on Inpatient Glycemic Control (2015)  Target Ranges:  Prepandial:   less than 140 mg/dL      Peak postprandial:   less than 180 mg/dL (1-2 hours)      Critically ill patients:  140 - 180 mg/dL   Lab Results  Component Value Date   GLUCAP 161 (H) 03/01/2020   HGBA1C 13.0 (H) 02/28/2020    Review of Glycemic Control Results for Glen Webb, Glen Webb (MRN [id number]) as of 03/01/2020 11:14  Ref. Range 02/29/2020 21:30 02/29/2020 22:04 03/01/2020 01:44 03/01/2020 05:28 03/01/2020 08:01  Glucose-Capillary Latest Ref Range: 70 - 99 mg/dL 105 (H) 113 (H) 178 (H) 182 (H) 161 (H)   Inpatient Diabetes Program Recommendations:   Spoke with pt via phone @ length (DM coordinator @ [facility]) about new diagnosis. Discussed A1C results with them and explained what an A1C is, basic pathophysiology of DM Type 2, basic home care, basic diabetes diet nutrition principles, importance of checking CBGs and maintaining good CBG control to prevent long-term and short-term complications. Reviewed signs and symptoms of hyperglycemia and hypoglycemia and how to treat hypoglycemia at home. Also reviewed blood sugar goals at home.  RNs to provide ongoing basic DM education at bedside with this patient. Have ordered educational booklet and insulin starter kit. Sent insulin pen instruction video from [facility] to patient's cell phone. Discussed basic plate method nutrition and answered questions.  Gave information regarding free diabetes education classes.  Patient prefers insulin pens for home use for flexibility. Discharged needs: -Novolin Relion 70/30 pen # [id number] -Insulin pen needed [id number] Patient recently bought a glucometer and has strips. Gave information for Relion meter and strips also when runs out of current strips.  Patient very engaged in discussion about diabetes and verbalized understanding.  Thank you, Nani Gasser. [name], RN, MSN,  CDE  Diabetes Coordinator Inpatient Glycemic Control Team Team Pager [phone] (8am-5pm) 03/01/2020 11:21 AM

## 2020-03-01 NOTE — TOC Progression Note (Signed)
Transition of Care [facility]) - Progression Note    Patient Details  Name: Glen Webb MRN: [id number] Date of Birth: 05-Jan-1961  [facility] CM/SW Contact  Karn Cassis, [place] Phone Number: 03/01/2020, 12:02 PM  Clinical Narrative: TOC received consult due to new diabetes diagnosis requiring insulin and pt does not have insurance. 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 He states he has been able to afford medications in the past. Pt states he was told Metformin would be $4 and his insulin would be $25. Pt indicates he will not have any issue affording this at this time. No other needs reported.            Expected Discharge Plan and Services           Expected Discharge Date: 03/01/20                                     Social Determinants of Health (SDOH) Interventions    Readmission Risk Interventions No flowsheet data found.

## 2020-03-01 NOTE — Discharge Summary (Signed)
Physician Discharge Summary  Glen Webb DGU:[id number] DOB: [birth year] DOA: 02/28/2020  PCP: Patient, No Pcp Per  Admit date: 02/28/2020 Discharge date: 03/01/2020  Admitted From: Home Disposition:  Home   Recommendations for Outpatient Follow-up:  1. Follow up with PCP in 1-2 weeks 2. Please obtain BMP/CBC in one week     Discharge Condition: Stable CODE STATUS: FU LL Diet recommendation: Heart Healthy / Carb Modified  Brief/Interim Summary:  59 y.o. male with medical history significant of class I obesity, hypercholesterolemia, hypertension, just diagnosed with type 2 diabetes mellitus who is coming to the emergency department due to abnormal lab results.  The patient states that on 02/24/2020 he did not feel well and he went to the [facility] and have some lab work done.  He was called yesterday and was told that his hemoglobin A1c is 13.5 that his LFTs were abnormal.  He checked his CBG yesterday evening hyperglycemia in the 400s so he was told to come to the ED.  He has been having nausea and mild epigastric discomfort.  He  has had some significant polyuria in the past week.  He was started on IVF and IV insulin with clinical improvement.  He was transitioned to [place] 70/30, Relion brand for d/c.  His diet was advanced which he tolerated.  Labwork: CBC showed a white count of 7.3, ammonia 17.2 g/dL and platelets 956.  BMP sodium of 123, potassium 3.5, chloride 96 and CO2 10 mmol/L.  His anion gap was 17.  Glucose 254, BUN 11, creatinine 0.96 and calcium 8.7.  Magnesium was 2.3 and phosphorus 1.8 mg/dL.  Venous gas shows a pH of 7.204, PCO2 of 32.3 and PO2 of less than 31 mmHg.  Bicarbonate was 12.7 and acid base deficit was 14.2 mmol/L.  Beta hydroxybutyric acid was 8.03 mmol/L.  Liver function tests showed AST of 77 and ALT of 78 units/L.  The rest of the hepatic functions are unremarkable.  Discharge Diagnoses:  DKA type 2 -patient started on IV insulin with q 1 hour CBG check  and q 4 hour BMPs -pt started on aggressive fluid resuscitation -Electrolytes were monitored and repleted -transitioned to [place] insulin once anion gap closed -diet was advanced once anion gap closed -HbA1C--13.0 -due to cost/affordability, patient agrees to go home with 70/30 insulin 25 units bid -start metformin after d/c    Hyponatremia GU losses/polydipsia/hyperglycemia. Continue IV fluids. Overall improved  Hypertension -continue amlodipine    Hypophosphatemia Oral replacement ordered.     Polycythemia Likely hemoconcentrated. Continue IV hydration.     Transaminitis Repeat transaminases level in a.m. Check right upper quadrant ultrasound--hepatic steatosis    Discharge Instructions   Allergies as of 03/01/2020   Not on File     Medication List    TAKE these medications   amLODipine 10 MG tablet Commonly known as: NORVASC Take 10 mg by mouth daily.   metFORMIN 500 MG tablet Commonly known as: GLUCOPHAGE Take 1 tablet (500 mg total) by mouth daily with breakfast.   NovoLIN 70/30 ReliOn (70-30) 100 UNIT/ML injection Generic drug: insulin NPH-regular Human Inject 25 Units into the skin 2 (two) times daily with a meal.   VITAMIN C PO Take 1 tablet by mouth daily.   Vitamin D-3 125 MCG (5000 UT) Tabs Take 1 tablet by mouth daily.   ZINC PO Take 1 tablet by mouth daily.       Not on File  Consultations:  none   Procedures/Studies: US Abdomen Limited RUQ (  LIVER/GB)  Result Date: 02/29/2020 CLINICAL DATA:  Transaminitis EXAM: ULTRASOUND ABDOMEN LIMITED RIGHT UPPER QUADRANT COMPARISON:  None. FINDINGS: Gallbladder: No gallstones or wall thickening visualized. No sonographic Murphy sign noted by sonographer. Common bile duct: Diameter: Upper limits normal in diameter at 6-7 mm. Liver: Increased echotexture compatible with fatty infiltration. No focal abnormality or biliary ductal dilatation. Portal vein is patent on color Doppler imaging with  normal direction of blood flow towards the liver. Other: None. IMPRESSION: Hepatic steatosis. No acute findings. Electronically Signed   By: Charlett Nose M.D.   On: 02/29/2020 09:47         Discharge Exam: Vitals:   03/01/20 0800 03/01/20 0900  BP: 125/81 132/87  Pulse:  79  Resp: 20 (!) 22  Temp:    SpO2:  100%   Vitals:   03/01/20 0500 03/01/20 0530 03/01/20 0800 03/01/20 0900  BP: 106/67 121/76 125/81 132/87  Pulse: 73   79  Resp: (!) 21 20 20  (!) 22  Temp:      TempSrc:      SpO2: 96%   100%  Weight:      Height:        General: Pt is alert, awake, not in acute distress Cardiovascular: RRR, S1/S2 +, no rubs, no gallops Respiratory: CTA bilaterally, no wheezing, no rhonchi Abdominal: Soft, NT, ND, bowel sounds + Extremities: no edema, no cyanosis   The results of significant diagnostics from this hospitalization (including imaging, microbiology, ancillary and laboratory) are listed below for reference.    Significant Diagnostic Studies: Abdomen Limited RUQ (LIVER/GB)  Result Date: 02/29/2020 CLINICAL DATA:  Transaminitis EXAM: ULTRASOUND ABDOMEN LIMITED RIGHT UPPER QUADRANT COMPARISON:  None. FINDINGS: Gallbladder: No gallstones or wall thickening visualized. No sonographic Murphy sign noted by sonographer. Common bile duct: Diameter: Upper limits normal in diameter at 6-7 mm. Liver: Increased echotexture compatible with fatty infiltration. No focal abnormality or biliary ductal dilatation. Portal vein is patent on color Doppler imaging with normal direction of blood flow towards the liver. Other: None. IMPRESSION: Hepatic steatosis. No acute findings. Electronically Signed   By: 03/02/2020 M.D.   On: 02/29/2020 09:47     Microbiology: No results found for this or any previous visit (from the past 240 hour(s)).   Labs: Basic Metabolic Panel: Recent Labs  Lab 02/29/20 0352 02/29/20 0740 02/29/20 1423 02/29/20 1634 03/01/20 0523  NA 128* 127* 134* 135  133*  K 3.8 4.0 3.3* 3.3* 4.0  CL 104 105 109 108 108  CO2 8* 12* 13* 15* 17*  GLUCOSE 202* 161* 133* 129* 186*  BUN 11 11 10 9 7   CREATININE 0.78 0.69 0.70 0.61 0.71  CALCIUM 8.2* 8.2* 8.4* 8.3* 7.9*  MG 2.3  --   --  1.8 2.3  PHOS 1.8*  --   --   --   --    Liver Function Tests: Recent Labs  Lab 02/29/20 0138  AST 77*  ALT 78*  ALKPHOS 93  BILITOT 1.1  PROT 6.8  ALBUMIN 4.1   No results for input(s): LIPASE, AMYLASE in the last 168 hours. No results for input(s): AMMONIA in the last 168 hours. CBC: Recent Labs  Lab 02/28/20 2357  WBC 7.3  NEUTROABS 5.2  HGB 17.2*  HCT 46.8  MCV 86.2  PLT 232   Cardiac Enzymes: No results for input(s): CKTOTAL, CKMB, CKMBINDEX, TROPONINI in the last 168 hours. BNP: Invalid input(s): POCBNP CBG: Recent Labs  Lab 02/29/20 2130 02/29/20 2204 03/01/20  0144 03/01/20 0528 03/01/20 0801  GLUCAP 105* 113* 178* 182* 161*    Time coordinating discharge:  36 minutes  Signed:  Catarina Hartshorn, DO [facility] Pager: [phone] 03/01/2020, 9:56 AM

## 2020-03-06 ENCOUNTER — Other Ambulatory Visit: Payer: Self-pay

## 2020-03-06 ENCOUNTER — Ambulatory Visit (INDEPENDENT_AMBULATORY_CARE_PROVIDER_SITE_OTHER): Payer: Self-pay | Admitting: Nurse Practitioner

## 2020-03-06 ENCOUNTER — Encounter (INDEPENDENT_AMBULATORY_CARE_PROVIDER_SITE_OTHER): Payer: Self-pay | Admitting: Nurse Practitioner

## 2020-03-06 VITALS — BP 136/88 | HR 93 | Temp 97.1°F | Ht 66.5 in | Wt 198.4 lb

## 2020-03-06 DIAGNOSIS — E1165 Type 2 diabetes mellitus with hyperglycemia: Secondary | ICD-10-CM

## 2020-03-06 DIAGNOSIS — I10 Essential (primary) hypertension: Secondary | ICD-10-CM

## 2020-03-06 DIAGNOSIS — E669 Obesity, unspecified: Secondary | ICD-10-CM

## 2020-03-06 MED ORDER — NOVOLIN 70/30 RELION (70-30) 100 UNIT/ML ~~LOC~~ SUSP
20.0000 [IU] | Freq: Two times a day (BID) | SUBCUTANEOUS | 1 refills | Status: DC
Start: 2020-03-06 — End: 2020-03-20

## 2020-03-06 NOTE — Progress Notes (Signed)
   Subjective:  Patient ID: Glen Webb, male    DOB: 01/18/1961  Age: 60 y.o. MRN: [id number]  CC:  Chief Complaint  Patient presents with  . Establish Care    Recent hospital visit  . Diabetes  . Follow-up      HPI  This patient arrives today for the above.  He is a new patient comes practice.  Previously he was getting his health care completed by urgent care.  He was recently diagnosed with diabetes within the last week or so.  He was hospitalized in the emergency department from 12/27-12/29 with DKA.  A1c checked in hospital and it was 13.0.  Since he was discharged she was started on Metformin as well as 70/30 insulin.  He does have a glucometer and is checking his blood sugar regularly at home.  Generally his blood sugars are running anywhere from 105-195.  He does report 2 hypoglycemic events over the last week.  Overall he is feeling fairly well, but does report some blurry vision.  He tells me that at time of hospitalization he was told that his cholesterol was greater than 1000.  Unfortunately I do not have access to this blood work.   Past Medical History:  Diagnosis Date  . Class 1 obesity   . Hypercholesteremia   . Hypertension   . Type 2 diabetes mellitus (HCC)       Family History  Problem Relation Age of Onset  . Hypertension Mother   . CAD Father   . Heart attack Father     Social History   Social History Narrative  . Not on file   Social History   Tobacco Use  . Smoking status: Former Smoker  . Smokeless tobacco: Never Used  Substance Use Topics  . Alcohol use: Never     Current Meds  Medication Sig  . amLODipine (NORVASC) 10 MG tablet Take 10 mg by mouth daily.  . Ascorbic Acid (VITAMIN C PO) Take 1 tablet by mouth daily.  . Cholecalciferol (VITAMIN D-3) 125 MCG (5000 UT) TABS Take 1 tablet by mouth daily.  . metFORMIN (GLUCOPHAGE) 500 MG tablet Take 1 tablet (500 mg total) by mouth daily with breakfast.  . Multiple  Vitamins-Minerals (MULTIVITAMIN WITH MINERALS) tablet Take 1 tablet by mouth daily.  . Multiple Vitamins-Minerals (ZINC PO) Take 1 tablet by mouth daily.  . [DISCONTINUED] insulin NPH-regular Human (NOVOLIN 70/30 RELION) (70-30) 100 UNIT/ML injection Inject 25 Units into the skin 2 (two) times daily with a meal.    ROS:  Review of Systems  Constitutional: Positive for malaise/fatigue. Negative for fever.  Eyes: Positive for blurred vision.  Respiratory: Negative for cough and shortness of breath.   Cardiovascular: Negative for chest pain and palpitations.  Neurological: Negative for dizziness and headaches.     Objective:   Today's Vitals: BP 136/88   Pulse 93   Temp (!) 97.1 F (36.2 C) (Temporal)   Ht 5' 6.5" (1.689 m)   Wt 198 lb 6.4 oz (90 kg)   SpO2 97%   BMI 31.54 kg/m  Vitals with BMI 03/06/2020 03/01/2020 03/01/2020  Height 5' 6.5" - -  Weight 198 lbs 6 oz - -  BMI 31.55 - -  Systolic 136 128 137  Diastolic 88 77 83  Pulse 93 82 79     Physical Exam Vitals reviewed.  Constitutional:      Appearance: Normal appearance.  HENT:     Head: Normocephalic and atraumatic.    Neck:     Vascular: No carotid bruit.  Cardiovascular:     Rate and Rhythm: Normal rate and regular rhythm.  Pulmonary:     Effort: Pulmonary effort is normal.     Breath sounds: Normal breath sounds.  Musculoskeletal:     Cervical back: Neck supple.  Skin:    General: Skin is warm and dry.  Neurological:     Mental Status: He is alert and oriented to person, place, and time.  Psychiatric:        Mood and Affect: Mood normal.        Behavior: Behavior normal.        Thought Content: Thought content normal.        Judgment: Judgment normal.          Assessment and Plan   1. Type 2 diabetes mellitus with hyperglycemia, without long-term current use of insulin (HCC)   2. Hypertension, unspecified type   3. Class 1 obesity      Plan: 1.  Had extensive conversation regarding his  new diagnosis of diabetes.  Will reduce his insulin to 20 units twice daily due to having multiple hypoglycemic events over the last week.  He will monitor his blood sugar closely and I have given him parameters regarding how to further titrate his insulin based on his blood sugars.  Will refer to diabetes coordinator/educator and to ophthalmology for diabetic eye exam.  Will check blood work today including CMP, lipid panel, urine for albuminuria, and screen for thyroid disease. 2.  He will continue on his amlodipine as currently prescribed. 3.  We will offer him education recommendations regarding dietary changes.   Of note, patient tells me he is not interested in having flu or pneumonia shots administered today.  He also has not had COVID-19 vaccines administered.  For now he will hold off on all vaccinations per his preference.  Did discuss importance of being up-to-date with vaccines especially regarding the fact that he has now been diagnosed with diabetes and is at increased risk for more severe disease.  He tells me he will consider this.  Tests ordered Orders Placed This Encounter  Procedures  . CMP with eGFR([facility])  . Lipid Panel  . TSH  . Microalbumin/Creatinine Ratio, Urine  . Urinalysis  . Referral to Nutrition and Diabetes Services  . Ambulatory referral to Ophthalmology      Meds ordered this encounter  Medications  . insulin NPH-regular Human (NOVOLIN 70/30 RELION) (70-30) 100 UNIT/ML injection    Sig: Inject 20 Units into the skin 2 (two) times daily with a meal.    Dispense:  10 mL    Refill:  1    Order Specific Question:   Supervising Provider    Answer:   GOSRANI, NIMISH C [[id number]]    Patient to follow-up in 2 weeks or sooner as needed to closely monitor blood sugar readings based on changes to medications.  SARAH E GRAY, NP  

## 2020-03-06 NOTE — Patient Instructions (Addendum)
Goal Fasting Blood Sugar: 80-130 Goal Postmeal blood sugar (2 hours after eating a meal): is <180  If blood sugar is less than 70 then treat your hypoglycemia by eating a small meal or 2 small pieces of candy or drinking 4 ounces of juice.  Check your blood sugar again in 15 to 30 minutes to ensure that your blood sugar normalized.  If you see a blood sugar less than 70 (also known as a hypoglycemic event) 2 or more times between now and your next appointment then reduce your insulin dose to 18 units twice a day.  If you see a fasting blood sugar greater than 200 or a postmeal/random blood sugar greater than 300 two or more times between now and your next appointment increase her insulin dose to 22 units twice a day.  If you have any questions please sign up for my chart and send a message to me through your MyChart or call the office.  [facility] Dietary Recommendations for Weight Loss What to Avoid . Avoid added sugars o Often added sugar can be found in processed foods such as many condiments, dry cereals, cakes, cookies, chips, crisps, crackers, candies, sweetened drinks, etc.  o Read labels and AVOID/DECREASE use of foods with the following in their ingredient list: Sugar, fructose, high fructose corn syrup, sucrose, glucose, maltose, dextrose, molasses, cane sugar, brown sugar, any type of syrup, agave nectar, etc.   . Avoid snacking in between meals . Avoid foods made with flour o If you are going to eat food made with flour, choose those made with whole-grains; and, minimize your consumption as much as is tolerable . Avoid processed foods o These foods are generally stocked in the middle of the grocery store. Focus on shopping on the perimeter of the grocery.  . Avoid Meat  o We recommend following a plant-based diet at [facility]. Thus, we recommend avoiding meat as a general rule. Consider eating beans, legumes, eggs, and/or dairy products for regular protein  sources o If you plan on eating meat limit to 4 ounces of meat at a time and choose lean options such as Fish, chicken, [place]. Avoid red meat intake such as pork and/or steak What to Include . Vegetables o GREEN LEAFY VEGETABLES: Kale, spinach, mustard greens, collard greens, cabbage, broccoli, etc. o OTHER: Asparagus, cauliflower, eggplant, carrots, peas, Brussel sprouts, tomatoes, bell peppers, zucchini, beets, cucumbers, etc. . Grains, seeds, and legumes o Beans: kidney beans, black eyed peas, garbanzo beans, black beans, pinto beans, etc. o Whole, unrefined grains: brown rice, barley, bulgur, oatmeal, etc. . Healthy fats  o Avoid highly processed fats such as vegetable oil o Examples of healthy fats: avocado, olives, virgin olive oil, dark chocolate (?72% Cocoa), nuts (peanuts, almonds, walnuts, cashews, pecans, etc.) . None to Low Intake of Animal Sources of Protein o Meat sources: chicken, [place], salmon, tuna. Limit to 4 ounces of meat at one time. o Consider limiting dairy sources, but when choosing dairy focus on: PLAIN [place] yogurt, cottage cheese, high-protein milk . Fruit o Choose berries  When to Eat . Intermittent Fasting: o Choosing not to eat for a specific time period, but DO FOCUS ON HYDRATION when fasting o Multiple Techniques: - Time Restricted Eating: eat 3 meals in a day, each meal lasting no more than 60 minutes, no snacks between meals - 16-18 hour fast: fast for 16 to 18 hours up to 7 days a week. Often suggested to start with 2-3 nonconsecutive days  per week.  . Remember the time you sleep is counted as fasting.  . Examples of eating schedule: Fast from 7:00pm-11:00am. Eat between 11:00am-7:00pm.  - 24-hour fast: fast for 24 hours up to every other day. Often suggested to start with 1 day per week . Remember the time you sleep is counted as fasting . Examples of eating schedule:  o Eating day: eat 2-3 meals on your eating day. If doing 2 meals, each meal should  last no more than 90 minutes. If doing 3 meals, each meal should last no more than 60 minutes. Finish last meal by 7:00pm. o Fasting day: Fast until 7:00pm.  o IF YOU FEEL UNWELL FOR ANY REASON/IN ANY WAY WHEN FASTING, STOP FASTING BY EATING A NUTRITIOUS SNACK OR LIGHT MEAL o ALWAYS FOCUS ON HYDRATION DURING FASTS - Acceptable Hydration sources: water, broths, tea/coffee (black tea/coffee is best but using a small amount of whole-fat dairy products in coffee/tea is acceptable).  - Poor Hydration Sources: anything with sugar or artificial sweeteners added to it  These recommendations have been developed for patients that are actively receiving medical care from either Dr. Karilyn Cota or Jiles Prows, DNP, NP-C at [facility]. These recommendations are developed for patients with specific medical conditions and are not meant to be distributed or used by others that are not actively receiving care from either provider listed above at [facility]. It is not appropriate to participate in the above eating plans without proper medical supervision.   Reference: Lawrence Santiago. The obesity code. [place]; 2016.

## 2020-03-07 ENCOUNTER — Other Ambulatory Visit (INDEPENDENT_AMBULATORY_CARE_PROVIDER_SITE_OTHER): Payer: Self-pay | Admitting: Nurse Practitioner

## 2020-03-07 DIAGNOSIS — E1165 Type 2 diabetes mellitus with hyperglycemia: Secondary | ICD-10-CM

## 2020-03-07 DIAGNOSIS — E782 Mixed hyperlipidemia: Secondary | ICD-10-CM

## 2020-03-07 LAB — URINALYSIS
Bilirubin Urine: NEGATIVE
Glucose, UA: NEGATIVE
Hgb urine dipstick: NEGATIVE
Leukocytes,Ua: NEGATIVE
Nitrite: NEGATIVE
Protein, ur: NEGATIVE
Specific Gravity, Urine: 1.013 (ref 1.001–1.03)
pH: 5.5 (ref 5.0–8.0)

## 2020-03-07 LAB — COMPLETE METABOLIC PANEL WITH GFR
AG Ratio: 1.7 (calc) (ref 1.0–2.5)
ALT: 48 U/L — ABNORMAL HIGH (ref 9–46)
AST: 45 U/L — ABNORMAL HIGH (ref 10–35)
Albumin: 3.9 g/dL (ref 3.6–5.1)
Alkaline phosphatase (APISO): 72 U/L (ref 35–144)
BUN: 15 mg/dL (ref 7–25)
CO2: 25 mmol/L (ref 20–32)
Calcium: 9.3 mg/dL (ref 8.6–10.3)
Chloride: 102 mmol/L (ref 98–110)
Creat: 0.79 mg/dL (ref 0.70–1.33)
GFR, Est African American: 116 mL/min/{1.73_m2} (ref >=60)
GFR, Est Non African American: 100 mL/min/{1.73_m2} (ref >=60)
Globulin: 2.3 g/dL (calc) (ref 1.9–3.7)
Glucose, Bld: 164 mg/dL — ABNORMAL HIGH (ref 65–99)
Potassium: 3.5 mmol/L (ref 3.5–5.3)
Sodium: 138 mmol/L (ref 135–146)
Total Bilirubin: 0.5 mg/dL (ref 0.2–1.2)
Total Protein: 6.2 g/dL (ref 6.1–8.1)

## 2020-03-07 LAB — MICROALBUMIN / CREATININE URINE RATIO
Creatinine, Urine: 112 mg/dL (ref 20–320)
Microalb Creat Ratio: 10 mcg/mg creat (ref <30)
Microalb, Ur: 1.1 mg/dL

## 2020-03-07 LAB — LIPID PANEL
Cholesterol: 597 mg/dL — ABNORMAL HIGH (ref <200)
HDL: 27 mg/dL — ABNORMAL LOW (ref >=40)
Non-HDL Cholesterol (Calc): 570 mg/dL (calc) — ABNORMAL HIGH (ref <130)
Total CHOL/HDL Ratio: 22.1 (calc) — ABNORMAL HIGH (ref <5.0)
Triglycerides: 1286 mg/dL — ABNORMAL HIGH (ref <150)

## 2020-03-07 LAB — TSH: TSH: 1.56 mIU/L (ref 0.40–4.50)

## 2020-03-07 MED ORDER — OMEGA-3-ACID ETHYL ESTERS 1 G PO CAPS: 1.0000 g | ORAL_CAPSULE | Freq: Every day | ORAL | 1 refills | Status: DC

## 2020-03-07 NOTE — Progress Notes (Signed)
Please let me know if this is or is not the correct referral order that you needed. Thank you.

## 2020-03-08 ENCOUNTER — Other Ambulatory Visit: Payer: Self-pay

## 2020-03-08 ENCOUNTER — Encounter: Payer: Self-pay | Attending: Internal Medicine | Admitting: Nutrition

## 2020-03-08 ENCOUNTER — Encounter: Payer: Self-pay | Admitting: Nutrition

## 2020-03-08 VITALS — Ht 66.5 in | Wt 202.0 lb

## 2020-03-08 DIAGNOSIS — I10 Essential (primary) hypertension: Secondary | ICD-10-CM | POA: Insufficient documentation

## 2020-03-08 DIAGNOSIS — E782 Mixed hyperlipidemia: Secondary | ICD-10-CM | POA: Insufficient documentation

## 2020-03-08 DIAGNOSIS — E0810 Diabetes mellitus due to underlying condition with ketoacidosis without coma: Secondary | ICD-10-CM | POA: Insufficient documentation

## 2020-03-08 DIAGNOSIS — E669 Obesity, unspecified: Secondary | ICD-10-CM | POA: Insufficient documentation

## 2020-03-08 NOTE — Patient Instructions (Addendum)
Goals  Follow My Plate Eat 3-4 carb choices per meal Eat meals on time Keep walking Watch portion sizes Get A1C down to 7% Avoid high fat and processed foods Test blood sugar before insulin twice a day.

## 2020-03-08 NOTE — Progress Notes (Signed)
Medical Nutrition Therapy  Appointment Start time:  1415  Appointment End time: 1515  Primary concerns today: New Type 2 DM Referral diagnosis: E11.8, i10.0, E78.2, Preferred learning style:  no preference indicated Learning readiness:  ready,  Lost 6 lbs  Recently got out of the hospital for DKA. Type 2  Family none; no alchol consumption  Use to eat fast food all meals. Doesn't cook much but willing to start cooking at home. Just started walking on treadmill daily.   He reports making a lot of changes, especially cutting out sodas and drinking water. [facility] visit: DKA.  NUTRITION ASSESSMENT   FBS 96-187: FBS:118-142  Bedtime 120-187 mg/dl. Lowest was 66 ml/dl mid morning.  Anthropometrics  Wt Readings from Last 3 Encounters:  03/08/20 202 lb (91.6 kg)  03/06/20 198 lb 6.4 oz (90 kg)  02/28/20 202 lb (91.6 kg)   Ht Readings from Last 3 Encounters:  03/08/20 5' 6.5" (1.689 m)  03/06/20 5' 6.5" (1.689 m)  02/28/20 5\' 6"  (1.676 m)   Body mass index is 32.12 kg/m. @BMIFA @ Facility age limit for growth percentiles is 20 years. Facility age limit for growth percentiles is 20 years.   Clinical Medical Hx: HTN, Hyperlipidemia Medications: 70/30 insulin 20 units BID, Metformin 500 mg once a day. Labs:  Lab Results  Component Value Date   HGBA1C 13.0 (H) 02/28/2020   CMP Latest Ref Rng & Units 03/06/2020 03/01/2020 02/29/2020  Glucose 65 - 99 mg/dL 03/03/2020) 03/02/2020) 102(V)  BUN 7 - 25 mg/dL 15 7 9   Creatinine 0.70 - 1.33 mg/dL 253(G 644(I  Sodium 135 - 146 mmol/L 138 133(L) 135  Potassium 3.5 - 5.3 mmol/L 3.5 4.0 3.3(L)  Chloride 98 - 110 mmol/L 102 108 108  CO2 20 - 32 mmol/L 25 17(L) 15(L)  Calcium 8.6 - 10.3 mg/dL 9.3 7.9(L) 8.3(L)  Total Protein 6.1 - 8.1 g/dL 6.2 - -  Total Bilirubin 0.2 - 1.2 mg/dL 0.5 - -  Alkaline Phos 38 - 126 U/L - - -  AST 10 - 35 U/L 45(H) - -  ALT 9 - 46 U/L 48(H) - -   Lipid Panel     Component Value Date/Time   CHOL 597 (H) 03/06/2020  0901   TRIG 1,286 (H) 03/06/2020 0901   HDL 27 (L) 03/06/2020 0901   CHOLHDL 22.1 (H) 03/06/2020 0901   LDLCALC  03/06/2020 0901     Comment:     . LDL cholesterol not calculated. Triglyceride levels greater than 400 mg/dL invalidate calculated LDL results. . Reference range: <100 . Desirable range <100 mg/dL for primary prevention;   <70 mg/dL for patients with CHD or diabetic patients  with > or = 2 CHD risk factors. 05/04/2020 LDL-C is now calculated using the Martin-Hopkins  calculation, which is a validated novel method providing  better accuracy than the Friedewald equation in the  estimation of LDL-C.  05/04/2020 et al. 05/04/2020. Marland Kitchen): 2061-2068  (http://education.[facility].com/faq/FAQ164)      Notable Signs/Symptoms: blurry vision, fatigue,  Lifestyle & Dietary Hx   Estimated daily fluid intake: 40oz Supplements: MVI, VIt D3, Zinc  Sleep: 6-8 hs  Stress / self-care: willing to improve self care Current average weekly physical activity: 1 mile a day  24-Hr Dietary Recall First Meal 3 eggs. 2 slices ww toast, lite mayo, apple and 2 glasses water 20oz. L) grilled chicken salad, water, honey mustard, Water, almond, pecans/ D) grilled chicken sandwich with whole grain bread, water   Estimated Energy  Needs Calories: 1800-2000 Carbohydrate: 225g Protein: 150g Fat: 56g   NUTRITION DIAGNOSIS  NB-1.1 Food and nutrition-related knowledge deficit As related to New Type 2 Dm and Hypertriglyceridemia.  As evidenced by DKA and TG > 1000 mg/dl.   NUTRITION INTERVENTION  Nutrition education (E-1) on the following topics:  . Nutrition and Diabetes education provided on My Plate, CHO counting, meal planning, portion sizes, timing of meals, avoiding snacks between meals unless having a low blood sugar, target ranges for A1C and blood sugars, signs/symptoms and treatment of hyper/hypoglycemia, monitoring blood sugars, taking medications as prescribed, benefits of  exercising 30 minutes per day and prevention of complications of DM. Marland Kitchen   Handouts Provided Include   My Plate  Meal Planning  Diabetes Instructions    Learning Style & Readiness for Change Teaching method utilized: Visual & Auditory  Demonstrated degree of understanding via: Teach Back  Barriers to learning/adherence to lifestyle change: None  Goals Established by Patient Goals  Follow My Plate Eat 3-4 carb choices per meal Eat meals on time Keep walking Watch portion sizes Get A1C down to 7% Avoid high fat and processed foods Test blood sugars before insulin twice a day.   MONITORING & EVALUATION Dietary intake, weekly physical activity, and blood sugars  in 1 month..  Next Steps  Patient is to

## 2020-03-08 NOTE — Progress Notes (Signed)
I resubmitted; we will see. [employer]

## 2020-03-20 ENCOUNTER — Telehealth (INDEPENDENT_AMBULATORY_CARE_PROVIDER_SITE_OTHER): Payer: Self-pay | Admitting: Nurse Practitioner

## 2020-03-20 ENCOUNTER — Encounter: Payer: Self-pay | Admitting: Nutrition

## 2020-03-20 ENCOUNTER — Other Ambulatory Visit: Payer: Self-pay

## 2020-03-20 ENCOUNTER — Encounter (INDEPENDENT_AMBULATORY_CARE_PROVIDER_SITE_OTHER): Payer: Self-pay | Admitting: Nurse Practitioner

## 2020-03-20 DIAGNOSIS — E1165 Type 2 diabetes mellitus with hyperglycemia: Secondary | ICD-10-CM

## 2020-03-20 MED ORDER — NOVOLIN 70/30 RELION (70-30) 100 UNIT/ML ~~LOC~~ SUSP: 18.0000 [IU] | Freq: Two times a day (BID) | SUBCUTANEOUS | 1 refills | Status: DC

## 2020-03-20 NOTE — Progress Notes (Signed)
An audio-only tele-health visit was conducted today. I connected with  Glen Webb on 03/20/20 utilizing audio-only technology and verified that I am speaking with the correct person using two identifiers. The patient was located at their home, and I was located at home during the encounter. I discussed the limitations of evaluation and management by telemedicine. The patient expressed understanding and agreed to proceed.   Subjective:  Patient ID: Glen Webb, male    DOB: 1961/03/04  Age: 60 y.o. MRN: [id number]  CC:  Chief Complaint  Patient presents with  . Diabetes      HPI  This patient arrives today for the above.  At his last office visit we reduced his 7030 from 25 to 20 units twice a day.  He tells me he has had 3 episodes since then where he has felt a little bit off and when he checked his blood sugar it was 70, 82, and 86.  He tells me his fasting blood sugars since his last office visit have been ranging from 94-129, and his postprandial blood sugars are generally in the 130s to 140s.  He tells me he has been making quite a few lifestyle changes and has completely cut out any sugary beverages.  He tells me he feels like he has been losing weight because he feels that his close are starting to fit a little bit looser.  I believe he has seen the dietitian.  He has also started on omega-3 fatty acid for treatment of his markedly elevated triglycerides.  He tells me he is tolerating this medication well.  He tells me overall he is feeling great.  Past Medical History:  Diagnosis Date  . Class 1 obesity   . Hypercholesteremia   . Hypertension   . Type 2 diabetes mellitus (HCC)       Family History  Problem Relation Age of Onset  . Hypertension Mother   . CAD Father   . Heart attack Father     Social History   Social History Narrative  . Not on file   Social History   Tobacco Use  . Smoking status: Former Games developer  . Smokeless tobacco: Never Used   Substance Use Topics  . Alcohol use: Never     Current Meds  Medication Sig  . amLODipine (NORVASC) 10 MG tablet Take 10 mg by mouth daily.  . Ascorbic Acid (VITAMIN C PO) Take 1 tablet by mouth daily.  . Cholecalciferol (VITAMIN D-3) 125 MCG (5000 UT) TABS Take 1 tablet by mouth daily.  . metFORMIN (GLUCOPHAGE) 500 MG tablet Take 1 tablet (500 mg total) by mouth daily with breakfast.  . Multiple Vitamins-Minerals (MULTIVITAMIN WITH MINERALS) tablet Take 1 tablet by mouth daily.  . Multiple Vitamins-Minerals (ZINC PO) Take 1 tablet by mouth daily.  Marland Kitchen omega-3 acid ethyl esters (LOVAZA) 1 g capsule Take 1 capsule (1 g total) by mouth daily.  Marland Kitchen zinc gluconate 50 MG tablet Take 50 mg by mouth daily.  . [DISCONTINUED] insulin NPH-regular Human (NOVOLIN 70/30 RELION) (70-30) 100 UNIT/ML injection Inject 20 Units into the skin 2 (two) times daily with a meal.    ROS:  Review of Systems  Constitutional: Negative for fever.  Eyes: Positive for blurred vision (improving now).  Respiratory: Negative for shortness of breath.   Cardiovascular: Negative for chest pain.  Genitourinary: Negative for urgency.  Endo/Heme/Allergies: Negative for polydipsia.       (-) polyphagia     Objective:  Today's Vitals: BP 128/75   Ht 5\' 6"  (1.676 m)   Wt 202 lb (91.6 kg)   BMI 32.60 kg/m  Vitals with BMI 03/19/2020 03/08/2020 03/06/2020  Height 5\' 6"  5' 6.5" 5' 6.5"  Weight 202 lbs 202 lbs 198 lbs 6 oz  BMI 32.62 32.12 31.55  Systolic 128 - 136  Diastolic 75 - 88  Pulse - - 93     Physical Exam Comprehensive physical exam not completed today as office visit was conducted remotely.  He sounded well over the phone.  Patient was alert and oriented, and appeared to have appropriate judgment.       Assessment and Plan   1. Type 2 diabetes mellitus with hyperglycemia, without long-term current use of insulin (HCC)      Plan: 1.  We will further reduce his 70/30 insulin to 18 units twice  daily.  I did tell him that if his fasting blood sugars are consistently up in the 150s or if his postprandial blood sugars are consistently around 200 that he should return to 20 units of 70/30 insulin twice a day.  I congratulated him and encouraged him to continue making the lifestyle changes that he has already made.  We will check fasting lipid panel at next office visit to monitor his triglycerides.  He will continue to take his omega-3 supplement in the meantime.  We did discuss that guidelines recommend all diabetics be on statin therapy as well as consider starting ACE or ARB, but we will discuss this further at next office visit before prescribing these medications as he is dealing with multiple changes regarding lifestyle and insulin therapy at this time.   Tests ordered No orders of the defined types were placed in this encounter.     Meds ordered this encounter  Medications  . insulin NPH-regular Human (NOVOLIN 70/30 RELION) (70-30) 100 UNIT/ML injection    Sig: Inject 18 Units into the skin 2 (two) times daily with a meal.    Dispense:  10 mL    Refill:  1    Order Specific Question:   Supervising Provider    Answer:   [[id number]]    Patient to follow-up in 1 month or sooner as needed.I spent 30 minutes dedicated to the care of this patient on the date of this encounter which includes a combination of either face-to-face or virtual contact with the patient and formulating and discussing adjustments to his medications.  04-22-1971, NP

## 2020-04-12 ENCOUNTER — Telehealth: Payer: Self-pay | Admitting: Nutrition

## 2020-04-20 ENCOUNTER — Ambulatory Visit (INDEPENDENT_AMBULATORY_CARE_PROVIDER_SITE_OTHER): Payer: Self-pay | Admitting: Nurse Practitioner

## 2020-04-20 ENCOUNTER — Other Ambulatory Visit: Payer: Self-pay

## 2020-04-20 VITALS — BP 149/86 | HR 85 | Temp 97.2°F | Ht 66.0 in | Wt 202.0 lb

## 2020-04-20 DIAGNOSIS — R7401 Elevation of levels of liver transaminase levels: Secondary | ICD-10-CM

## 2020-04-20 DIAGNOSIS — E7800 Pure hypercholesterolemia, unspecified: Secondary | ICD-10-CM

## 2020-04-20 DIAGNOSIS — E1165 Type 2 diabetes mellitus with hyperglycemia: Secondary | ICD-10-CM

## 2020-04-20 MED ORDER — NOVOLIN 70/30 RELION (70-30) 100 UNIT/ML ~~LOC~~ SUSP: 16.0000 [IU] | Freq: Two times a day (BID) | SUBCUTANEOUS | 1 refills | Status: DC

## 2020-04-20 NOTE — Patient Instructions (Signed)
Goal Fasting Blood Sugars: 80-130 Goal evening Blood Sugars: <150-180

## 2020-04-20 NOTE — Progress Notes (Signed)
Subjective:  Patient ID: Glen Webb, male    DOB: [birth year]  Age: 60 y.o. MRN: [id number]  CC:  Chief Complaint  Patient presents with  . Diabetes  . Hyperlipidemia  . Other    Elevated liver enzymes      HPI  This patient arrives today for the above.  Diabetes: Continues on 18 units of 70/30 insulin.  He tells me he is tolerating this well and denies any hypoglycemic events since his last office visit.  He does have at home blood sugar logs available to discuss today.  Highest blood sugar has been 143 over the last few weeks.  Generally blood sugars are running from 80s to low 100s.  He has made significant changes to his diet and he is increasing his activity by walking on treadmill.  Previous A1c was 13 and this was collected at time of his diabetes diagnosis which occurred just a couple of months ago.  He was hospitalized at that time for DKA.  Urine was checked for microalbuminuria and this was negative.  Hyperlipidemia/Elevated liver enzymes: Last cholesterol panel showed total cholesterol 597 with triglycerides greater than 1200.  He continues on omega-3 supplement.  And as stated above has made significant changes to his lifestyle since cholesterol panel was checked.  He did undergo ultrasound of his liver and it did show steatosis, last metabolic panel showed AST of 45 and ALT of 48.  Past Medical History:  Diagnosis Date  . Class 1 obesity   . Hypercholesteremia   . Hypertension   . Type 2 diabetes mellitus (HCC)       Family History  Problem Relation Age of Onset  . Hypertension Mother   . CAD Father   . Heart attack Father     Social History   Social History Narrative  . Not on file   Social History   Tobacco Use  . Smoking status: Former Games developer  . Smokeless tobacco: Never Used  Substance Use Topics  . Alcohol use: Never     Current Meds  Medication Sig  . amLODipine (NORVASC) 10 MG tablet Take 10 mg by mouth daily.  . Ascorbic Acid  (VITAMIN C PO) Take 1 tablet by mouth daily.  . Cholecalciferol (VITAMIN D-3) 125 MCG (5000 UT) TABS Take 1 tablet by mouth daily.  . metFORMIN (GLUCOPHAGE) 500 MG tablet Take 1 tablet (500 mg total) by mouth daily with breakfast.  . Multiple Vitamins-Minerals (MULTIVITAMIN WITH MINERALS) tablet Take 1 tablet by mouth daily.  . Multiple Vitamins-Minerals (ZINC PO) Take 1 tablet by mouth daily.  Marland Kitchen omega-3 acid ethyl esters (LOVAZA) 1 g capsule Take 1 capsule (1 g total) by mouth daily.  Marland Kitchen zinc gluconate 50 MG tablet Take 50 mg by mouth daily.  . [DISCONTINUED] insulin NPH-regular Human (NOVOLIN 70/30 RELION) (70-30) 100 UNIT/ML injection Inject 18 Units into the skin 2 (two) times daily with a meal.    ROS:  Review of Systems  Eyes: Negative for blurred vision.  Respiratory: Negative for shortness of breath.   Cardiovascular: Negative for chest pain.  Neurological: Negative for dizziness and headaches.     Objective:   Today's Vitals: BP (!) 149/86   Pulse 85   Temp (!) 97.2 F (36.2 C)   Ht 5\' 6"  (1.676 m)   Wt 202 lb (91.6 kg)   SpO2 99%   BMI 32.60 kg/m  Vitals with BMI 04/20/2020 03/19/2020 03/08/2020  Height 5\' 6"  5\' 6"  5'  6.5"  Weight 202 lbs 202 lbs 202 lbs  BMI 32.62 32.62 32.12  Systolic 149 128 -  Diastolic 86 75 -  Pulse 85 - -     Physical Exam Vitals reviewed.  Constitutional:      Appearance: Normal appearance.  HENT:     Head: Normocephalic and atraumatic.  Cardiovascular:     Rate and Rhythm: Normal rate and regular rhythm.     Pulses:          Dorsalis pedis pulses are 2+ on the right side and 2+ on the left side.  Pulmonary:     Effort: Pulmonary effort is normal.     Breath sounds: Normal breath sounds.  Musculoskeletal:     Cervical back: Neck supple.     Right foot: No deformity.     Left foot: No deformity.  Feet:     Right foot:     Protective Sensation: 10 sites tested. 10 sites sensed.     Skin integrity: Skin integrity normal.      Toenail Condition: Right toenails are normal.     Left foot:     Protective Sensation: 10 sites tested. 10 sites sensed.     Skin integrity: Skin integrity normal.     Toenail Condition: Left toenails are normal.  Skin:    General: Skin is warm and dry.  Neurological:     Mental Status: He is alert and oriented to person, place, and time.  Psychiatric:        Mood and Affect: Mood normal.        Behavior: Behavior normal.        Thought Content: Thought content normal.        Judgment: Judgment normal.          Assessment and Plan   1. Hypercholesteremia   2. Type 2 diabetes mellitus with hyperglycemia, without long-term current use of insulin (HCC)   3. Transaminitis      Plan: 1.,  3.  He will continue on his omega-3 supplement and will continue making the significant lifestyle changes he is already implemented.  He will follow-up in approximately 2 months at which point we will repeat a fasting lipid panel.  I have already discussed possibility of starting him on additional medication such as statin therapy and increasing his omega-3 supplement pending lipid panel results.  He tells me he understands.  Consider checking hepatitis C at next office visit when blood work is collected. 2.  His ultimate goal is to come off of insulin if possible, and because his blood sugars have been so well controlled we will reduce his 70/30 insulin dose to 16 units twice a day.  I told him we can further titrate down by 2 units every week as long as his blood sugars remain less than 130 when checked in a fasting level in the morning, and less than 180 in the evening.  He will be due for A1c to be checked at next office visit.   Tests ordered No orders of the defined types were placed in this encounter.     Meds ordered this encounter  Medications  . insulin NPH-regular Human (NOVOLIN 70/30 RELION) (70-30) 100 UNIT/ML injection    Sig: Inject 16 Units into the skin 2 (two) times daily  with a meal.    Dispense:  10 mL    Refill:  1    Order Specific Question:   Supervising Provider    Answer:  GOSRANI, NIMISH C [[id number]]    Patient to follow-up in 2 months or sooner as needed.  Elenore Paddy, NP

## 2020-05-01 ENCOUNTER — Telehealth (INDEPENDENT_AMBULATORY_CARE_PROVIDER_SITE_OTHER): Payer: Self-pay

## 2020-05-01 DIAGNOSIS — E1165 Type 2 diabetes mellitus with hyperglycemia: Secondary | ICD-10-CM

## 2020-05-01 MED ORDER — METFORMIN HCL 500 MG PO TABS: 500.0000 mg | ORAL_TABLET | Freq: Every day | ORAL | 0 refills | Status: DC

## 2020-05-01 NOTE — Telephone Encounter (Signed)
Patient called and stated that he needs a refill on the following medication:  metFORMIN (GLUCOPHAGE) 500 MG tablet  Last filled from Hospital on 03/01/2020, # 30 with 1 refill  [facility] pharmacy please.

## 2020-05-01 NOTE — Telephone Encounter (Signed)
Refill send to [facility]

## 2020-05-15 ENCOUNTER — Other Ambulatory Visit (INDEPENDENT_AMBULATORY_CARE_PROVIDER_SITE_OTHER): Payer: Self-pay | Admitting: Internal Medicine

## 2020-05-15 ENCOUNTER — Telehealth (INDEPENDENT_AMBULATORY_CARE_PROVIDER_SITE_OTHER): Payer: Self-pay

## 2020-05-15 MED ORDER — AMLODIPINE BESYLATE 10 MG PO TABS: 10.0000 mg | ORAL_TABLET | Freq: Every day | ORAL | 1 refills | Status: AC

## 2020-05-15 NOTE — Telephone Encounter (Signed)
Patient called and left a detailed voice message for him to have his Amlodipine 10 mg sent to [facility] and he would like a 90 day prescription please.  amLODipine (NORVASC) 10 MG tablet  Last sent by historical provider  [facility] 90 day

## 2020-05-24 ENCOUNTER — Telehealth (INDEPENDENT_AMBULATORY_CARE_PROVIDER_SITE_OTHER): Payer: Self-pay

## 2020-05-24 NOTE — Telephone Encounter (Signed)
Pt called to note he had a bad hang nail due to bite nails. He said it is is anxiety that make him bite nails so band they hurt. Pt has a finger that looks infected, yet he has been keeping very clean with  Antibacterial soap, peroxidize, and Triple antibiotic ointments. Just want to see if you think he may need to take a antibiotic?  Pt said  When he gets home he will send a mychart message and attach a  picture to see what you think.  But if it is not by what you read or see on picture later today, will you recommend him something OTC.

## 2020-05-24 NOTE — Telephone Encounter (Signed)
Ask him not to send any pictures.  I cannot usually see clearly what he is talking about so he will need an appointment to be seen next week.  Please set up an appointment.  If he cannot wait that long or things get worse, he will need to go to an urgent care to be seen.

## 2020-05-25 ENCOUNTER — Encounter (INDEPENDENT_AMBULATORY_CARE_PROVIDER_SITE_OTHER): Payer: Self-pay | Admitting: Internal Medicine

## 2020-05-25 ENCOUNTER — Ambulatory Visit (INDEPENDENT_AMBULATORY_CARE_PROVIDER_SITE_OTHER): Payer: Self-pay | Admitting: Internal Medicine

## 2020-05-25 ENCOUNTER — Other Ambulatory Visit: Payer: Self-pay

## 2020-05-25 VITALS — BP 120/78 | HR 86 | Temp 97.9°F | Resp 18 | Ht 66.0 in | Wt 202.0 lb

## 2020-05-25 DIAGNOSIS — L03012 Cellulitis of left finger: Secondary | ICD-10-CM

## 2020-05-25 MED ORDER — AMOXICILLIN-POT CLAVULANATE 875-125 MG PO TABS: 1.0000 | ORAL_TABLET | Freq: Two times a day (BID) | ORAL | 0 refills | Status: DC

## 2020-05-25 NOTE — Progress Notes (Signed)
Metrics: Intervention Frequency ACO  Documented Smoking Status Yearly  Screened one or more times in 24 months  Cessation Counseling or  Active cessation medication Past 24 months  Past 24 months   Guideline developer: UpToDate (See UpToDate for funding source) Date Released: 2014       Wellness Office Visit  Subjective:  Patient ID: Glen Webb, male    DOB: September 13, 1960  Age: 60 y.o. MRN: [id number]  CC: Left hand finger cellulitis HPI  This man describes erythema/swelling of the left ring finger, distal aspect for the last 6 days or so.  He says he is a "nail biter" and this may be the trigger.  He is also diabetic and thankfully has been able to come off insulin altogether by eating healthier. He is due to follow-up with Maralyn Sago next month regarding his diabetes and significant dyslipidemia with extremely high cholesterol levels as well as triglyceride levels. Past Medical History:  Diagnosis Date  . Class 1 obesity   . Hypercholesteremia   . Hypertension   . Type 2 diabetes mellitus (HCC)    Past Surgical History:  Procedure Laterality Date  . NOSE SURGERY       Family History  Problem Relation Age of Onset  . Hypertension Mother   . CAD Father   . 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   Social History   Tobacco Use  . Smoking status: Former Games developer  . Smokeless tobacco: Never Used  Substance Use Topics  . Alcohol use: Never    Current Meds  Medication Sig  . amLODipine (NORVASC) 10 MG tablet Take 1 tablet (10 mg total) by mouth daily.  Marland Kitchen amoxicillin-clavulanate (AUGMENTIN) 875-125 MG tablet Take 1 tablet by mouth 2 (two) times daily.  . Ascorbic Acid (VITAMIN C PO) Take 1 tablet by mouth daily.  . Cholecalciferol (VITAMIN D-3) 125 MCG (5000 UT) TABS Take 1 tablet by mouth daily.  . metFORMIN (GLUCOPHAGE) 500 MG tablet Take 1 tablet (500 mg total) by mouth daily with breakfast.  . Multiple  Vitamins-Minerals (MULTIVITAMIN WITH MINERALS) tablet Take 1 tablet by mouth daily.  . Multiple Vitamins-Minerals (ZINC PO) Take 1 tablet by mouth daily.  Marland Kitchen omega-3 acid ethyl esters (LOVAZA) 1 g capsule Take 1 capsule (1 g total) by mouth daily.  Marland Kitchen zinc gluconate 50 MG tablet Take 50 mg by mouth daily.  . [DISCONTINUED] insulin NPH-regular Human (NOVOLIN 70/30 RELION) (70-30) 100 UNIT/ML injection Inject 16 Units into the skin 2 (two) times daily with a meal.       Objective:   Today's Vitals: BP 120/78   Pulse 86   Temp 97.9 F (36.6 C) (Temporal)   Resp 18   Ht 5\' 6"  (1.676 m)   Wt 202 lb (91.6 kg)   SpO2 99%   BMI 32.60 kg/m  Vitals with BMI 05/25/2020 04/20/2020 03/19/2020  Height 5\' 6"  5\' 6"  5\' 6"   Weight 202 lbs 202 lbs 202 lbs  BMI 32.62 32.62 32.62  Systolic 120 149 03/21/2020  Diastolic 78 86 75  Pulse 86 85 -     Physical Exam   He clearly appears to have cellulitis of the distal finger in question.  He is systemically well.    Assessment   1. Cellulitis of finger of left hand       Tests ordered No orders of the defined types were placed in this encounter.  Plan: 1. I will treat him with Augmentin for 10 days.  If he does not improve, he will let me know. 2. We also briefly discussed intermittent fasting and I have given him a diet sheet.  He also seems to have improved his diet altogether and I have stressed the importance of a plant-based diet.   Meds ordered this encounter  Medications  . amoxicillin-clavulanate (AUGMENTIN) 875-125 MG tablet    Sig: Take 1 tablet by mouth 2 (two) times daily.    Dispense:  20 tablet    Refill:  0    Nimish Normajean Glasgow, MD

## 2020-05-25 NOTE — Telephone Encounter (Signed)
Return call to pt & he said he will continue to do  otc care . But if it starts tyo get worse he will call Monday to see if he can get in for acute visit.

## 2020-05-25 NOTE — Patient Instructions (Signed)
[facility] Dietary Recommendations for Weight Loss What to Avoid . Avoid added sugars o Often added sugar can be found in processed foods such as many condiments, dry cereals, cakes, cookies, chips, crisps, crackers, candies, sweetened drinks, etc.  o Read labels and AVOID/DECREASE use of foods with the following in their ingredient list: Sugar, fructose, high fructose corn syrup, sucrose, glucose, maltose, dextrose, molasses, cane sugar, brown sugar, any type of syrup, agave nectar, etc.   . Avoid snacking in between meals . Avoid foods made with flour o If you are going to eat food made with flour, choose those made with whole-grains; and, minimize your consumption as much as is tolerable . Avoid processed foods o These foods are generally stocked in the middle of the grocery store. Focus on shopping on the perimeter of the grocery.  . Avoid Meat  o We recommend following a plant-based diet at [facility]. Thus, we recommend avoiding meat as a general rule. Consider eating beans, legumes, eggs, and/or dairy products for regular protein sources o If you plan on eating meat limit to 4 ounces of meat at a time and choose lean options such as Fish, chicken, turkey. Avoid red meat intake such as pork and/or steak What to Include . Vegetables o GREEN LEAFY VEGETABLES: Kale, spinach, mustard greens, collard greens, cabbage, broccoli, etc. o OTHER: Asparagus, cauliflower, eggplant, carrots, peas, Brussel sprouts, tomatoes, bell peppers, zucchini, beets, cucumbers, etc. . Grains, seeds, and legumes o Beans: kidney beans, black eyed peas, garbanzo beans, black beans, pinto beans, etc. o Whole, unrefined grains: brown rice, barley, bulgur, oatmeal, etc. . Healthy fats  o Avoid highly processed fats such as vegetable oil o Examples of healthy fats: avocado, olives, virgin olive oil, dark chocolate (?72% Cocoa), nuts (peanuts, almonds, walnuts, cashews, pecans, etc.) . None to Low  Intake of Animal Sources of Protein o Meat sources: chicken, turkey, salmon, tuna. Limit to 4 ounces of meat at one time. o Consider limiting dairy sources, but when choosing dairy focus on: PLAIN Greek yogurt, cottage cheese, high-protein milk . Fruit o Choose berries  When to Eat . Intermittent Fasting: o Choosing not to eat for a specific time period, but DO FOCUS ON HYDRATION when fasting o Multiple Techniques: - Time Restricted Eating: eat 3 meals in a day, each meal lasting no more than 60 minutes, no snacks between meals - 16-18 hour fast: fast for 16 to 18 hours up to 7 days a week. Often suggested to start with 2-3 nonconsecutive days per week.  . Remember the time you sleep is counted as fasting.  . Examples of eating schedule: Fast from 7:00pm-11:00am. Eat between 11:00am-7:00pm.  - 24-hour fast: fast for 24 hours up to every other day. Often suggested to start with 1 day per week . Remember the time you sleep is counted as fasting . Examples of eating schedule:  o Eating day: eat 2-3 meals on your eating day. If doing 2 meals, each meal should last no more than 90 minutes. If doing 3 meals, each meal should last no more than 60 minutes. Finish last meal by 7:00pm. o Fasting day: Fast until 7:00pm.  o IF YOU FEEL UNWELL FOR ANY REASON/IN ANY WAY WHEN FASTING, STOP FASTING BY EATING A NUTRITIOUS SNACK OR LIGHT MEAL o ALWAYS FOCUS ON HYDRATION DURING FASTS - Acceptable Hydration sources: water, broths, tea/coffee (black tea/coffee is best but using a small amount of whole-fat dairy products in coffee/tea is acceptable).  -   Poor Hydration Sources: anything with sugar or artificial sweeteners added to it  These recommendations have been developed for patients that are actively receiving medical care from either Dr. Romina Divirgilio or Glen Gray, DNP, NP-C at [facility]. These recommendations are developed for patients with specific medical conditions and are not meant to be  distributed or used by others that are not actively receiving care from either provider listed above at [facility]. It is not appropriate to participate in the above eating plans without proper medical supervision.   Reference: Fung, J. The obesity code. [place]: [place]; 2016.   

## 2020-06-15 ENCOUNTER — Telehealth (INDEPENDENT_AMBULATORY_CARE_PROVIDER_SITE_OTHER): Payer: Self-pay

## 2020-06-15 DIAGNOSIS — E782 Mixed hyperlipidemia: Secondary | ICD-10-CM

## 2020-06-15 MED ORDER — OMEGA-3-ACID ETHYL ESTERS 1 G PO CAPS: 1.0000 g | ORAL_CAPSULE | Freq: Every day | ORAL | 1 refills | Status: AC

## 2020-06-15 NOTE — Telephone Encounter (Signed)
Patient called need med refill  omega-3 acid ethyl esters (LOVAZA) 1 g capsule  [facility] [place]

## 2020-06-15 NOTE — Telephone Encounter (Signed)
Refill authorized and sent to [facility] in [place].

## 2020-06-15 NOTE — Addendum Note (Signed)
Addended by: Elenore Paddy on: 06/15/2020 05:29 PM   Modules accepted: Orders

## 2020-06-22 ENCOUNTER — Ambulatory Visit (INDEPENDENT_AMBULATORY_CARE_PROVIDER_SITE_OTHER): Payer: Self-pay | Admitting: Nurse Practitioner

## 2020-07-18 ENCOUNTER — Ambulatory Visit (INDEPENDENT_AMBULATORY_CARE_PROVIDER_SITE_OTHER): Payer: Self-pay | Admitting: Nurse Practitioner

## 2020-08-10 ENCOUNTER — Encounter (INDEPENDENT_AMBULATORY_CARE_PROVIDER_SITE_OTHER): Payer: Self-pay | Admitting: Nurse Practitioner

## 2020-08-10 ENCOUNTER — Other Ambulatory Visit: Payer: Self-pay

## 2020-08-10 ENCOUNTER — Ambulatory Visit (INDEPENDENT_AMBULATORY_CARE_PROVIDER_SITE_OTHER): Payer: Self-pay | Admitting: Nurse Practitioner

## 2020-08-10 VITALS — BP 128/78 | HR 73 | Temp 97.7°F | Ht 66.0 in | Wt 198.6 lb

## 2020-08-10 DIAGNOSIS — E1165 Type 2 diabetes mellitus with hyperglycemia: Secondary | ICD-10-CM

## 2020-08-10 DIAGNOSIS — E782 Mixed hyperlipidemia: Secondary | ICD-10-CM

## 2020-08-10 NOTE — Progress Notes (Signed)
Subjective:  Patient ID: Glen Webb, male    DOB: [birth year]  Age: 60 y.o. MRN: [id number]  CC:  Chief Complaint  Patient presents with   Follow-up    Doing well, no concerns   Hyperlipidemia   Diabetes      HPI  This patient arrives today for the above.  Hyperlipidemia/Diabetes: He was diagnosed with diabetes approximately 7 months ago which point he was found to be in DKA and was hospitalized.  Since then he has made significant changes in his lifestyle has been able to come off of his insulin and is on metformin alone.  He also had severely elevated triglycerides when he was hospitalized in December.  Today he is due to have A1c and lipid panel rechecked.  He tells me his at home blood sugars run anywhere from 96-125.  He is also been losing weight consistently and continues to lose weight.  He is not currently on statin therapy but does take omega-3 daily.   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   Social History   Tobacco Use   Smoking status: Former    Pack years: 0.00   Smokeless tobacco: Never  Substance Use Topics   Alcohol use: Never     Current Meds  Medication Sig   amLODipine (NORVASC) 10 MG tablet Take 1 tablet (10 mg total) by mouth daily.   Ascorbic Acid (VITAMIN C PO) Take 1 tablet by mouth daily.   Cholecalciferol (VITAMIN D-3) 125 MCG (5000 UT) TABS Take 1 tablet by mouth daily.   metFORMIN (GLUCOPHAGE) 500 MG tablet Take 1 tablet (500 mg total) by mouth daily with breakfast.   Multiple Vitamins-Minerals (MULTIVITAMIN WITH MINERALS) tablet Take 1 tablet by mouth daily.   Multiple Vitamins-Minerals (ZINC PO) Take 1 tablet by mouth daily.   omega-3  acid ethyl esters (LOVAZA) 1 g capsule Take 1 capsule (1 g total) by mouth daily.   zinc gluconate 50 MG tablet Take 50 mg by mouth daily.    ROS:  Review of Systems  Eyes:  Negative for blurred vision.  Respiratory:  Negative for shortness of breath.   Cardiovascular:  Negative for chest pain.  Neurological:  Negative for dizziness and headaches.    Objective:   Today's Vitals: BP 128/78   Pulse 73   Temp 97.7 F (36.5 C) (Temporal)   Ht 5' 6"  (1.676 m)   Wt 198 lb 9.6 oz (90.1 kg)   SpO2 98%   BMI 32.05 kg/m  Vitals with BMI 08/10/2020 05/25/2020 04/20/2020  Height 5' 6"  5' 6"  5' 6"   Weight 198 lbs 10 oz 202 lbs 202 lbs  BMI 32.07 10.30 13.14  Systolic 388 875 797  Diastolic 78 78 86  Pulse 73 86 85     Physical Exam Vitals reviewed.  Constitutional:      Appearance: Normal appearance.  HENT:     Head: Normocephalic and atraumatic.  Cardiovascular:     Rate and Rhythm: Normal rate and regular rhythm.  Pulmonary:     Effort: Pulmonary effort is normal.     Breath  sounds: Normal breath sounds.  Musculoskeletal:     Cervical back: Neck supple.  Skin:    General: Skin is warm and dry.  Neurological:     Mental Status: He is alert and oriented to person, place, and time.  Psychiatric:        Mood and Affect: Mood normal.        Behavior: Behavior normal.        Thought Content: Thought content normal.        Judgment: Judgment normal.         Assessment and Plan   1. Elevated triglycerides with high cholesterol   2. Type 2 diabetes mellitus with hyperglycemia, without long-term current use of insulin (HCC)      Plan: 1.,  2.  We will check A1c as well as lipid panel and CMP today.  I did offer hepatitis C screening but as he is self-pay he like to hold off on this for now.  Further recommendations may be made based upon his blood work results, for now continue taking his medications as currently prescribed.  Tests ordered Orders Placed This Encounter   Procedures   Lipid Panel   Hemoglobin A1c   CMP with eGFR([facility])      No orders of the defined types were placed in this encounter.   Patient to follow-up in 3 months or sooner as needed.  Ailene Ards, NP

## 2020-08-14 LAB — HEMOGLOBIN A1C

## 2020-08-14 LAB — COMPLETE METABOLIC PANEL WITH GFR

## 2020-08-14 LAB — LIPID PANEL

## 2020-08-15 ENCOUNTER — Other Ambulatory Visit (INDEPENDENT_AMBULATORY_CARE_PROVIDER_SITE_OTHER): Payer: Self-pay | Admitting: Nurse Practitioner

## 2020-08-15 DIAGNOSIS — E1165 Type 2 diabetes mellitus with hyperglycemia: Secondary | ICD-10-CM

## 2020-08-15 DIAGNOSIS — E782 Mixed hyperlipidemia: Secondary | ICD-10-CM

## 2020-08-15 NOTE — Progress Notes (Signed)
Please call this patient and get him rescheduled for lab draw as his blood work never resulted due to the blood samples not being picked up over the weekend. I have reordered the labs. Thank you.

## 2020-08-21 ENCOUNTER — Other Ambulatory Visit (INDEPENDENT_AMBULATORY_CARE_PROVIDER_SITE_OTHER): Payer: Self-pay

## 2020-08-22 LAB — HEMOGLOBIN A1C
Hgb A1c MFr Bld: 5.7 % of total Hgb — ABNORMAL HIGH (ref <5.7)
Mean Plasma Glucose: 117 mg/dL
eAG (mmol/L): 6.5 mmol/L

## 2020-08-22 LAB — COMPLETE METABOLIC PANEL WITH GFR
AG Ratio: 1.9 (calc) (ref 1.0–2.5)
ALT: 19 U/L (ref 9–46)
AST: 16 U/L (ref 10–35)
Albumin: 4.4 g/dL (ref 3.6–5.1)
Alkaline phosphatase (APISO): 91 U/L (ref 35–144)
BUN: 21 mg/dL (ref 7–25)
CO2: 27 mmol/L (ref 20–32)
Calcium: 9.3 mg/dL (ref 8.6–10.3)
Chloride: 105 mmol/L (ref 98–110)
Creat: 0.98 mg/dL (ref 0.70–1.33)
GFR, Est African American: 99 mL/min/{1.73_m2} (ref >=60)
GFR, Est Non African American: 85 mL/min/{1.73_m2} (ref >=60)
Globulin: 2.3 g/dL (calc) (ref 1.9–3.7)
Glucose, Bld: 106 mg/dL — ABNORMAL HIGH (ref 65–99)
Potassium: 4.1 mmol/L (ref 3.5–5.3)
Sodium: 139 mmol/L (ref 135–146)
Total Bilirubin: 0.4 mg/dL (ref 0.2–1.2)
Total Protein: 6.7 g/dL (ref 6.1–8.1)

## 2020-08-22 LAB — LIPID PANEL
Cholesterol: 247 mg/dL — ABNORMAL HIGH (ref <200)
HDL: 30 mg/dL — ABNORMAL LOW (ref >=40)
Non-HDL Cholesterol (Calc): 217 mg/dL (calc) — ABNORMAL HIGH (ref <130)
Total CHOL/HDL Ratio: 8.2 (calc) — ABNORMAL HIGH (ref <5.0)
Triglycerides: 530 mg/dL — ABNORMAL HIGH (ref <150)

## 2020-08-23 ENCOUNTER — Other Ambulatory Visit (INDEPENDENT_AMBULATORY_CARE_PROVIDER_SITE_OTHER): Payer: Self-pay | Admitting: Nurse Practitioner

## 2020-11-16 ENCOUNTER — Ambulatory Visit (INDEPENDENT_AMBULATORY_CARE_PROVIDER_SITE_OTHER): Payer: Self-pay | Admitting: Nurse Practitioner

## 2020-11-27 ENCOUNTER — Other Ambulatory Visit (INDEPENDENT_AMBULATORY_CARE_PROVIDER_SITE_OTHER): Payer: Self-pay | Admitting: Nurse Practitioner

## 2020-11-27 DIAGNOSIS — E1165 Type 2 diabetes mellitus with hyperglycemia: Secondary | ICD-10-CM

## 2021-01-27 ENCOUNTER — Other Ambulatory Visit (INDEPENDENT_AMBULATORY_CARE_PROVIDER_SITE_OTHER): Payer: Self-pay | Admitting: Nurse Practitioner

## 2021-01-27 DIAGNOSIS — E782 Mixed hyperlipidemia: Secondary | ICD-10-CM

## 2021-07-25 IMAGING — US US ABDOMEN LIMITED
1 series · 14 of 20 positions shown · non-contrast
Comparison: None.

CLINICAL DATA: Transaminitis

EXAM:
ULTRASOUND ABDOMEN LIMITED RIGHT UPPER QUADRANT

[Series 1: us abdomen limited ruq (liver/gb) · 14 of 20 slices shown]
[im 1/20]
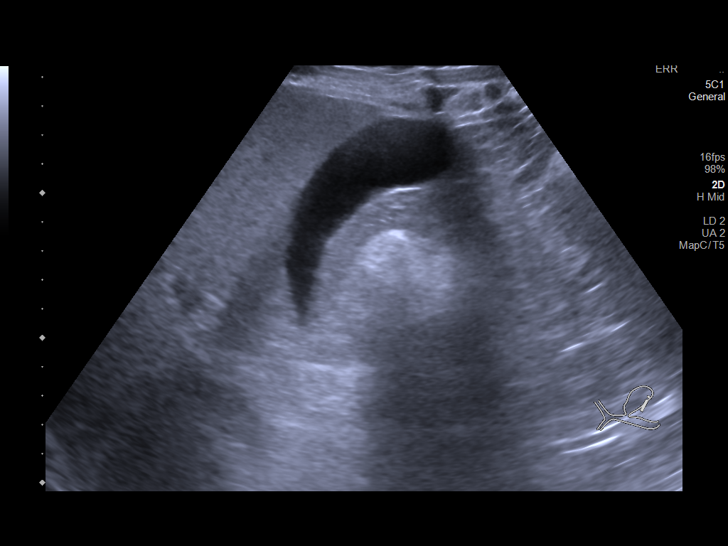
[im 3/20]
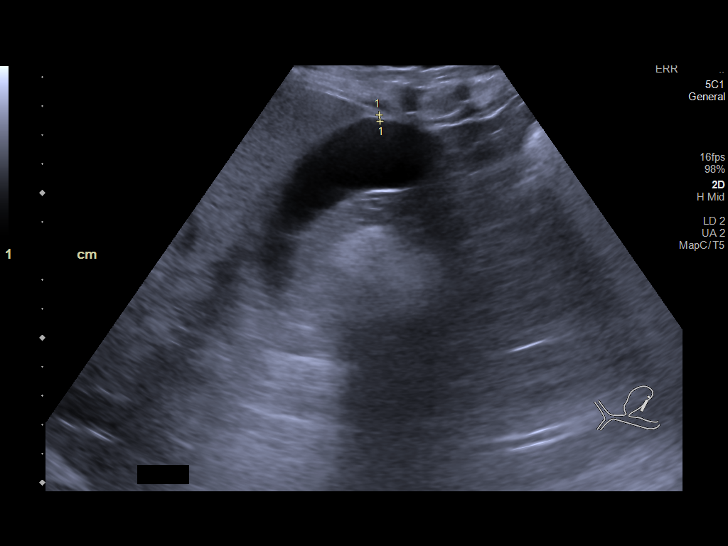
[im 4/20]
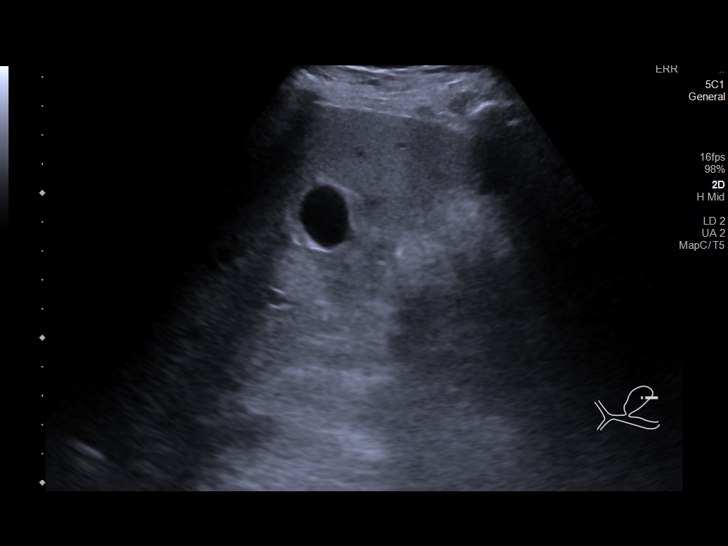
[im 6/20]
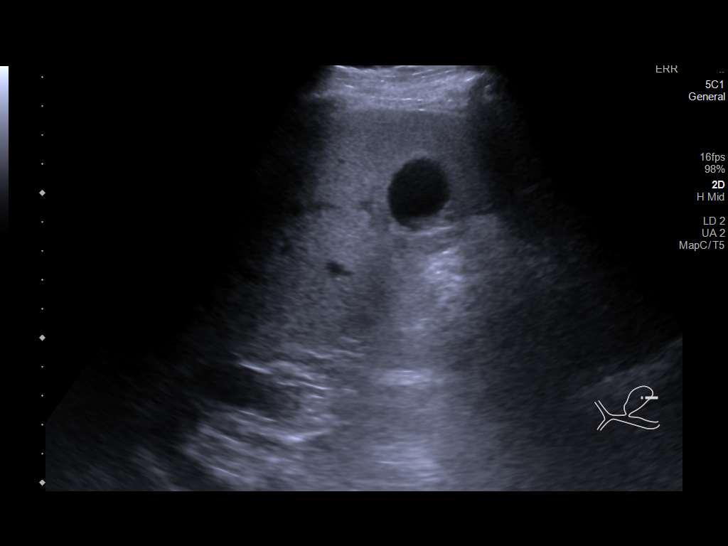
[im 7/20]
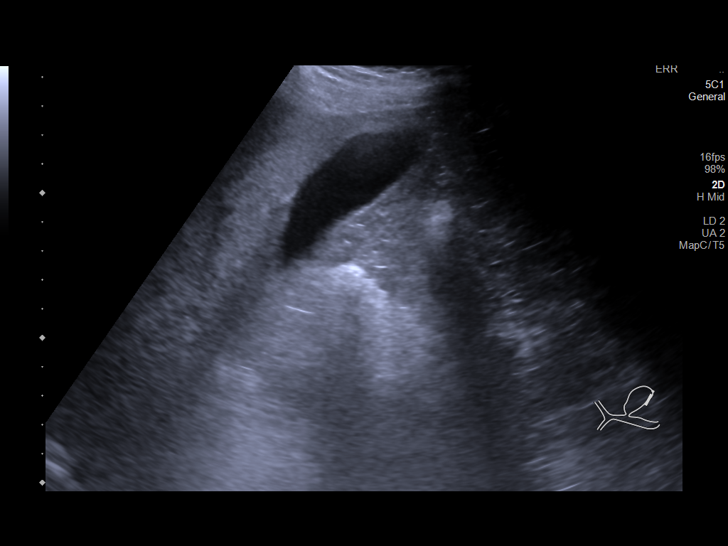
[im 8/20]
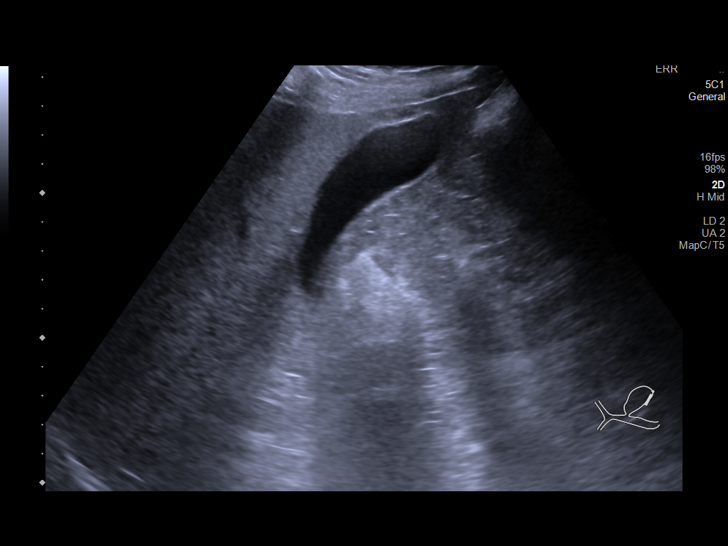
[im 10/20]
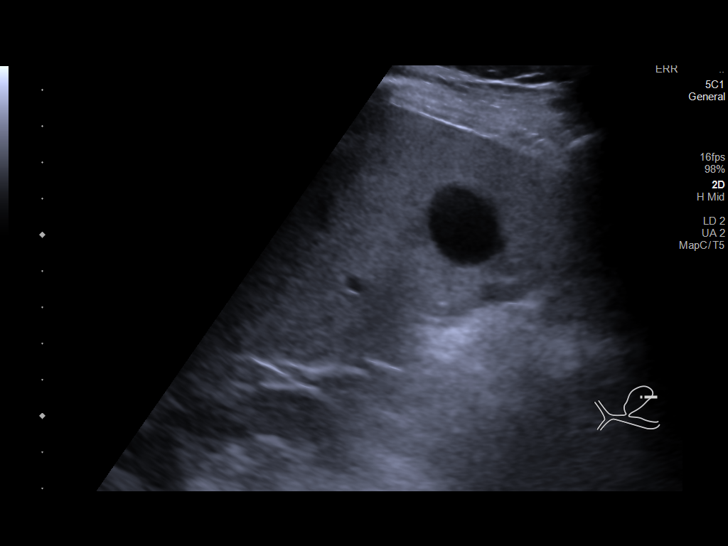
[im 11/20]
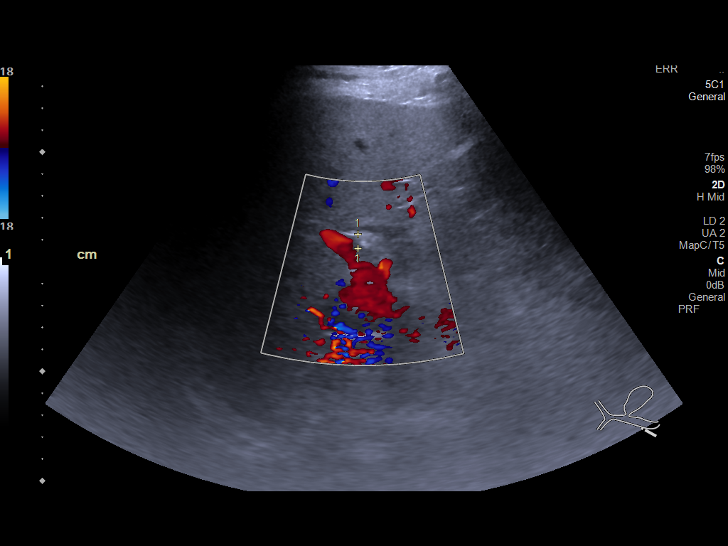
[im 13/20]
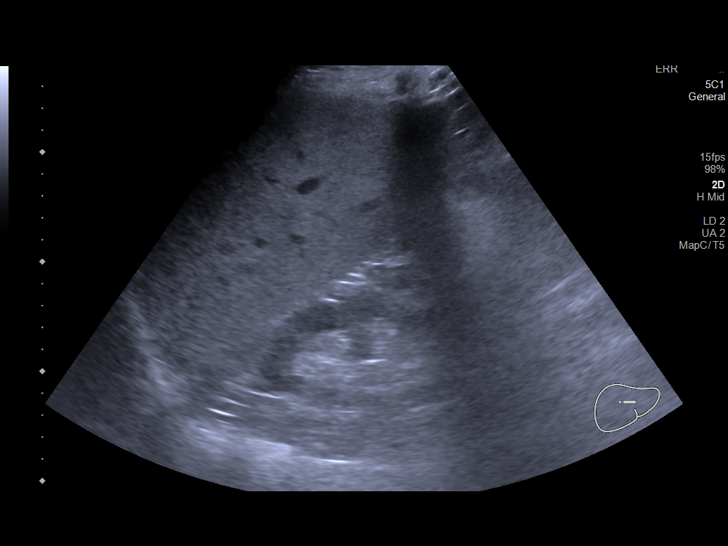
[im 14/20]
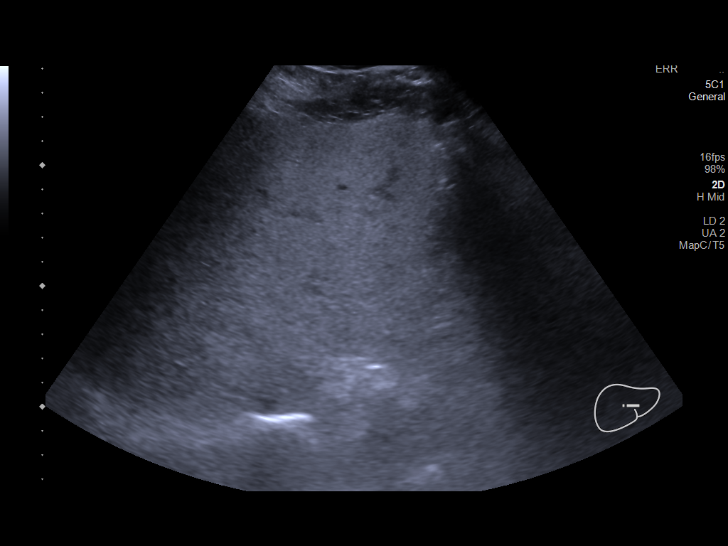
[im 16/20]
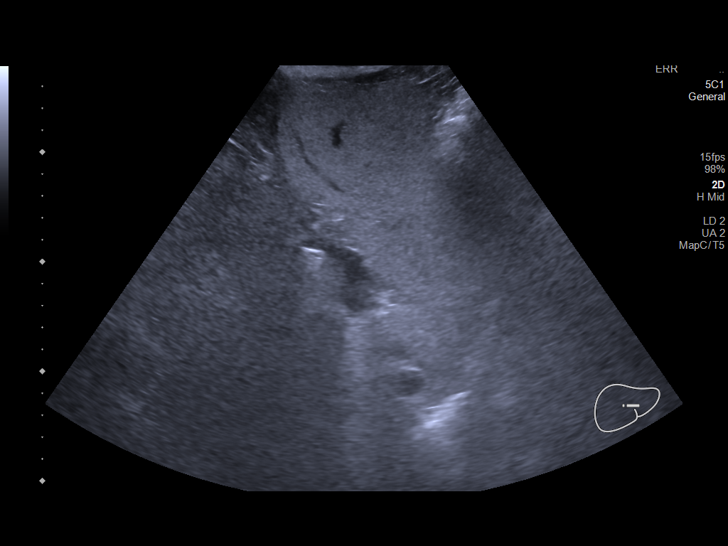
[im 17/20]
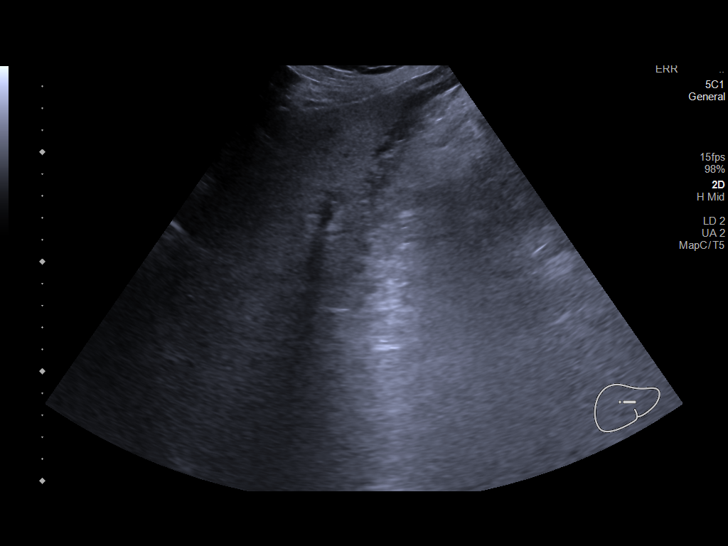
[im 18/20]
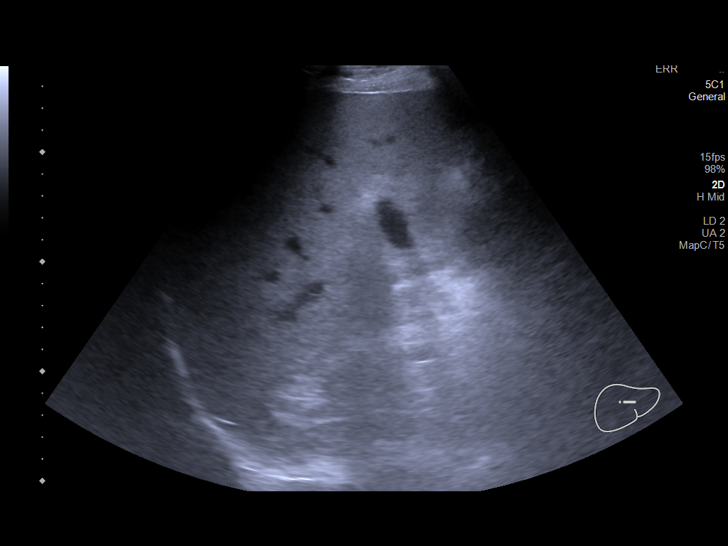
[im 20/20]
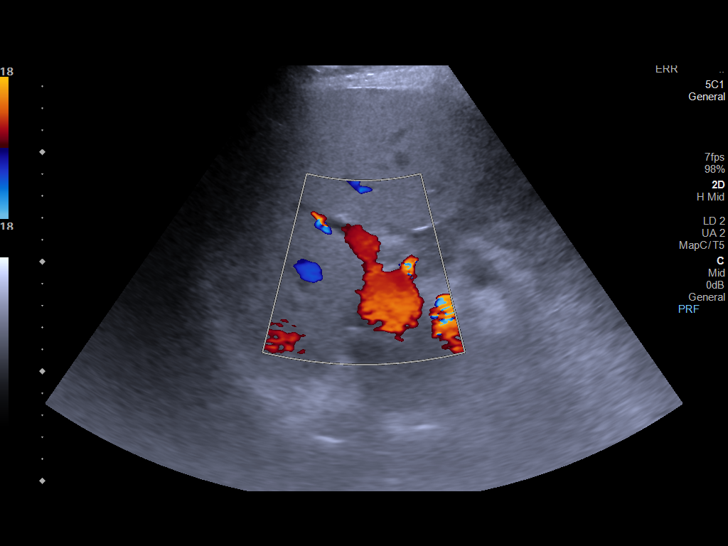

[14 of 20 positions shown; findings below may reference images not displayed]

FINDINGS: Gallbladder:

No gallstones or wall thickening visualized. No sonographic Murphy
sign noted by sonographer.

Common bile duct:

Diameter: Upper limits normal in diameter at 6-7 mm.

Liver:

Increased echotexture compatible with fatty infiltration. No focal
abnormality or biliary ductal dilatation. Portal vein is patent on
color Doppler imaging with normal direction of blood flow towards
the liver.

Other: None.
IMPRESSION: Hepatic steatosis.

No acute findings.
